# Patient Record
Sex: Female | Born: 1995 | Hispanic: No | Marital: Single
Health system: Northeastern US, Academic
[De-identification: ages and names within clinical notes are randomized; demographics above are authoritative.]

## PROBLEM LIST (undated history)

## (undated) DIAGNOSIS — R6882 Decreased libido: Secondary | ICD-10-CM

## (undated) DIAGNOSIS — B9689 Other specified bacterial agents as the cause of diseases classified elsewhere: Secondary | ICD-10-CM

## (undated) DIAGNOSIS — N760 Acute vaginitis: Secondary | ICD-10-CM

## (undated) DIAGNOSIS — N898 Other specified noninflammatory disorders of vagina: Secondary | ICD-10-CM

## (undated) HISTORY — PX: WISDOM TOOTH EXTRACTION: SHX21

## (undated) HISTORY — PX: OTHER SURGICAL HISTORY: SHX169

## (undated) HISTORY — DX: Acute vaginitis: N76.0

## (undated) HISTORY — DX: Other specified noninflammatory disorders of vagina: N89.8

## (undated) HISTORY — PX: BREAST ENHANCEMENT SURGERY: SHX7

## (undated) HISTORY — PX: CLEFT PALATE REPAIR: SUR1165

## (undated) HISTORY — DX: Other specified bacterial agents as the cause of diseases classified elsewhere: B96.89

## (undated) HISTORY — DX: Decreased libido: R68.82

---
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
## 2015-06-03 ENCOUNTER — Emergency Department (HOSPITAL_COMMUNITY)
Admission: EM | Admit: 2015-06-03 | Discharge: 2015-06-03 | Disposition: A | Discharge status: Home / Self Care | Payer: Commercial Managed Care - HMO | Attending: Emergency Medicine | Admitting: Emergency Medicine

## 2015-06-03 ENCOUNTER — Encounter (HOSPITAL_COMMUNITY): Payer: Self-pay | Admitting: Emergency Medicine

## 2015-06-03 DIAGNOSIS — J029 Acute pharyngitis, unspecified: Secondary | ICD-10-CM | POA: Diagnosis present

## 2015-06-03 DIAGNOSIS — Z79899 Other long term (current) drug therapy: Secondary | ICD-10-CM | POA: Insufficient documentation

## 2015-06-03 DIAGNOSIS — Z87730 Personal history of (corrected) cleft lip and palate: Secondary | ICD-10-CM | POA: Diagnosis not present

## 2015-06-03 DIAGNOSIS — J019 Acute sinusitis, unspecified: Secondary | ICD-10-CM | POA: Insufficient documentation

## 2015-06-03 LAB — RAPID STREP SCREEN (MED CTR MEBANE ONLY): Streptococcus, Group A Screen (Direct): NEGATIVE

## 2015-06-03 MED ORDER — AMOXICILLIN 500 MG PO CAPS: 500.0000 mg | ORAL_CAPSULE | Freq: Three times a day (TID) | ORAL | Status: DC

## 2015-06-03 MED ORDER — PSEUDOEPHEDRINE HCL 60 MG PO TABS: 60.0000 mg | ORAL_TABLET | ORAL | Status: DC | PRN

## 2015-06-03 MED ORDER — MAGIC MOUTHWASH W/LIDOCAINE: 5.0000 mL | Freq: Three times a day (TID) | ORAL | Status: DC | PRN

## 2015-06-03 NOTE — ED Provider Notes (Signed)
CSN: [id number]     Arrival date & time 06/03/15  0907 History   First MD Initiated Contact with Patient 06/03/15 [phone]     Chief Complaint  Patient presents with  . Sore Throat     (Consider location/radiation/quality/duration/timing/severity/associated sxs/prior Treatment) HPI   Tiffany Pugh is a 19 y.o. female who presents to the Emergency Department complaining of sore throat, cough, and nasal drainage for 3 days. States cough is mostly been nonproductive. She also reports facial pressure and pain with bending over. She has been producing nasal mucus. She's been using over-the-counter cold and cough medication without relief. 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 She denies any abdominal pain, vomiting, fever or chills, chest pain or shortness of breath.   History reviewed. No pertinent past medical history. Past Surgical History  Procedure Laterality Date  . Cleft palate repair    . Cleft palate repair    . Lafort ostomy    . Wisdom tooth extraction     History reviewed. No pertinent family history. Social History  Substance Use Topics  . Smoking status: Never Smoker   . Smokeless tobacco: None  . Alcohol Use: No   OB History    No data available     Review of Systems  Constitutional: Negative for fever, chills, activity change and appetite change.  HENT: Positive for congestion, rhinorrhea, sinus pressure and sore throat. Negative for ear pain, facial swelling, trouble swallowing and voice change.   Eyes: Negative for pain and visual disturbance.  Respiratory: Negative for cough and shortness of breath.   Gastrointestinal: Negative for nausea, vomiting and abdominal pain.  Musculoskeletal: Negative for arthralgias, neck pain and neck stiffness.  Skin: Negative for color change and rash.  Neurological: Negative for dizziness, facial asymmetry, speech difficulty, numbness and headaches.  Hematological: Negative for adenopathy.   All other systems reviewed and are negative.     Allergies  Bee venom  Home Medications   Prior to Admission medications   Medication Sig Start Date End Date Taking? Authorizing Provider  cyclobenzaprine (FLEXERIL) 10 MG tablet Take 10 mg by mouth 3 (three) times daily as needed for muscle spasms.   Yes Historical Provider, MD  GuaiFENesin (MUCINEX PO) Take 1 tablet by mouth daily.   Yes Historical Provider, MD  ibuprofen (ADVIL,MOTRIN) 800 MG tablet Take 1 tablet (800 mg total) by mouth every 8 (eight) hours as needed for pain. 11/28/12  Yes Christopher Lawyer, PA-C   BP 120/67 mmHg  Pulse 83  Temp(Src) 97.6 F (36.4 C) (Oral)  Resp 18  Ht 5\' 4"  (1.626 m)  Wt 48.988 kg  BMI 18.53 kg/m2  SpO2 96%  LMP 05/25/2015 Physical Exam  Constitutional: She is oriented to person, place, and time. She appears well-developed and well-nourished. No distress.  HENT:  Head: Normocephalic and atraumatic.  Right Ear: Tympanic membrane and ear canal normal.  Left Ear: Tympanic membrane and ear canal normal.  Nose: Mucosal edema present.  Mouth/Throat: Uvula is midline and mucous membranes are normal. No trismus in the jaw. No uvula swelling. Posterior oropharyngeal edema and posterior oropharyngeal erythema present. No tonsillar abscesses.  Patient has cleft palate with previous repair  Neck: Normal range of motion. Neck supple.  Cardiovascular: Normal rate, regular rhythm and normal heart sounds.   Pulmonary/Chest: Effort normal and breath sounds normal. No stridor. No respiratory distress.  Abdominal: Soft. There is no splenomegaly. There is no tenderness.  Musculoskeletal: Normal range of  motion.  Lymphadenopathy:    She has no cervical adenopathy.  Neurological: She is alert and oriented to person, place, and time. She exhibits normal muscle tone. Coordination normal.  Skin: Skin is warm and dry.  Psychiatric: She has a normal mood and affect.  Nursing note and vitals reviewed.   ED  Course  Procedures (including critical care time) Labs Review Labs Reviewed  RAPID STREP SCREEN (NOT AT [facility])    Imaging Review No results found. I have personally reviewed and evaluated these images and lab results as part of my medical decision-making.    MDM   Final diagnoses:  Acute sinusitis, recurrence not specified, unspecified location    Patient is well-appearing. Vital signs are stable. She is nontoxic, airway is patent no concerning symptoms for peritonsillar abscess. Pt agrees to symptomatic tx and close PMD f/u if needed.    Kem Parkinson, PA-C 06/07/15 1314  Fredia Sorrow, MD 06/08/15 1310

## 2015-06-03 NOTE — ED Notes (Signed)
PT c/o sore throat with nasal drainage x3 days. PT denies any fever.

## 2015-06-03 NOTE — Discharge Instructions (Signed)
Sinusitis, Adult  Sinusitis is redness, soreness, and puffiness (inflammation) of the air pockets in the bones of your face (sinuses). The redness, soreness, and puffiness can cause air and mucus to get trapped in your sinuses. This can allow germs to grow and cause an infection.   HOME CARE    Drink enough fluids to keep your pee (urine) clear or pale yellow.   Use a humidifier in your home.   Run a hot shower to create steam in the bathroom. Sit in the bathroom with the door closed. Breathe in the steam 3-4 times a day.   Put a warm, moist washcloth on your face 3-4 times a day, or as told by your doctor.   Use salt water sprays (saline sprays) to wet the thick fluid in your nose. This can help the sinuses drain.   Only take medicine as told by your doctor.  GET HELP RIGHT AWAY IF:    Your pain gets worse.   You have very bad headaches.   You are sick to your stomach (nauseous).   You throw up (vomit).   You are very sleepy (drowsy) all the time.   Your face is puffy (swollen).   Your vision changes.   You have a stiff neck.   You have trouble breathing.  MAKE SURE YOU:    Understand these instructions.   Will watch your condition.   Will get help right away if you are not doing well or get worse.     This information is not intended to replace advice given to you by your health care provider. Make sure you discuss any questions you have with your health care provider.     Document Released: 11/27/2007 Document Revised: 07/01/2014 Document Reviewed: 01/14/2012  Elsevier Interactive Patient Education 2016 Elsevier Inc[employer]

## 2015-06-03 NOTE — ED Notes (Signed)
Pt made aware to return if symptoms worsen or if any life threatening symptoms occur.   

## 2015-06-07 LAB — CULTURE, GROUP A STREP

## 2015-08-07 ENCOUNTER — Emergency Department (HOSPITAL_COMMUNITY)
Admission: EM | Admit: 2015-08-07 | Discharge: 2015-08-07 | Disposition: A | Discharge status: Home / Self Care | Payer: Commercial Managed Care - HMO | Attending: Emergency Medicine | Admitting: Emergency Medicine

## 2015-08-07 ENCOUNTER — Encounter (HOSPITAL_COMMUNITY): Payer: Self-pay | Admitting: *Deleted

## 2015-08-07 DIAGNOSIS — Z791 Long term (current) use of non-steroidal anti-inflammatories (NSAID): Secondary | ICD-10-CM | POA: Insufficient documentation

## 2015-08-07 DIAGNOSIS — B349 Viral infection, unspecified: Secondary | ICD-10-CM | POA: Diagnosis not present

## 2015-08-07 DIAGNOSIS — Z79899 Other long term (current) drug therapy: Secondary | ICD-10-CM | POA: Diagnosis not present

## 2015-08-07 DIAGNOSIS — R52 Pain, unspecified: Secondary | ICD-10-CM | POA: Diagnosis present

## 2015-08-07 MED ORDER — ONDANSETRON 4 MG PO TBDP
ORAL_TABLET | ORAL | Status: AC
  Filled 2015-08-07: qty 1

## 2015-08-07 MED ORDER — IBUPROFEN 800 MG PO TABS
800.0000 mg | ORAL_TABLET | Freq: Once | ORAL | Status: AC
  Administered 2015-08-07: 800 mg via ORAL
  Filled 2015-08-07: qty 1

## 2015-08-07 MED ORDER — ONDANSETRON 4 MG PO TBDP: 4.0000 mg | ORAL_TABLET | Freq: Three times a day (TID) | ORAL | Status: DC | PRN

## 2015-08-07 MED ORDER — IBUPROFEN 800 MG PO TABS: 800.0000 mg | ORAL_TABLET | Freq: Three times a day (TID) | ORAL | Status: DC | PRN

## 2015-08-07 MED ORDER — ONDANSETRON 4 MG PO TBDP
4.0000 mg | ORAL_TABLET | Freq: Once | ORAL | Status: AC
  Administered 2015-08-07: 4 mg via ORAL

## 2015-08-07 NOTE — ED Provider Notes (Signed)
TIME SEEN: 4:55 AM  CHIEF COMPLAINT: Headache, sore throat, nasal congestion, dry cough, body aches  HPI: Pt is a 20 y.o. female with history of cleft palate repair who presents to the emergency department with complaints of one day of generalized starting headache, sore throat, nasal congestion, dry cough and body aches. No fevers or chills. No ear pain. No vomiting or diarrhea. No rash. No sick contacts or recent travel. States she was looking on the Internet and was concerned that she could have meningitis and also prompted her to come to the emergency department. She tried cough medicine at home without relief of symptoms.  ROS: See HPI Constitutional: no fever  Eyes: no drainage  ENT:  runny nose   Cardiovascular:  no chest pain  Resp: no SOB  GI: no vomiting GU: no dysuria Integumentary: no rash  Allergy: no hives  Musculoskeletal: no leg swelling  Neurological: no slurred speech ROS otherwise negative  PAST MEDICAL HISTORY/PAST SURGICAL HISTORY:  History reviewed. No pertinent past medical history.  MEDICATIONS:  Prior to Admission medications   Medication Sig Start Date End Date Taking? Authorizing Provider  amoxicillin (AMOXIL) 500 MG capsule Take 1 capsule (500 mg total) by mouth 3 (three) times daily. For 7 days 06/03/15   Tammy Triplett, PA-C  cyclobenzaprine (FLEXERIL) 10 MG tablet Take 10 mg by mouth 3 (three) times daily as needed for muscle spasms.    Historical Provider, MD  GuaiFENesin (MUCINEX PO) Take 1 tablet by mouth daily.    Historical Provider, MD  magic mouthwash w/lidocaine SOLN Take 5 mLs by mouth 3 (three) times daily as needed for mouth pain. Swish and spit, do not swallow 06/03/15   Tammy Triplett, PA-C  naproxen (NAPROSYN) 500 MG tablet Take 500 mg by mouth 2 (two) times daily with a meal.    Historical Provider, MD  pseudoephedrine (SUDAFED) 60 MG tablet Take 1 tablet (60 mg total) by mouth every 4 (four) hours as needed for congestion. 06/03/15   Tammy  Triplett, PA-C    ALLERGIES:  Allergies  Allergen Reactions  . Bee Venom Rash    SOCIAL HISTORY:  Social History  Substance Use Topics  . Smoking status: Never Smoker   . Smokeless tobacco: Not on file  . Alcohol Use: No    FAMILY HISTORY: History reviewed. No pertinent family history.  EXAM: BP 121/78 mmHg  Pulse 107  Temp(Src) 98.7 F (37.1 C)  Resp 18  Ht 5' 4.5" (1.638 m)  Wt 106 lb (48.081 kg)  BMI 17.92 kg/m2  SpO2 98%  LMP 08/01/2015 CONSTITUTIONAL: Alert and oriented and responds appropriately to questions. Well-appearing; well-nourished, afebrile, nontoxic HEAD: Normocephalic EYES: Conjunctivae clear, PERRL ENT: normal nose; no rhinorrhea; moist mucous membranes; pharynx without lesions noted, clear nasal edition in bilateral nostrils, some clear nasal sinus drainage in the posterior oropharynx, no tonsillar hypertrophy or exudate, no uvular deviation, no trismus or drooling, no Ludwig's angina, status post cleft palate repair NECK: Supple, no meningismus, no LAD, no nuchal rigidity  CARD: Regular and minimally tachycardic; S1 and S2 appreciated; no murmurs, no clicks, no rubs, no gallops RESP: Normal chest excursion without splinting or tachypnea; breath sounds clear and equal bilaterally; no wheezes, no rhonchi, no rales, no hypoxia or respiratory distress, speaking full sentences ABD/GI: Normal bowel sounds; non-distended; soft, non-tender, no rebound, no guarding, no peritoneal signs BACK:  The back appears normal and is non-tender to palpation, there is no CVA tenderness EXT: Normal ROM in all joints; non-tender  to palpation; no edema; normal capillary refill; no cyanosis, no calf tenderness or swelling    SKIN: Normal color for age and race; warm no rash NEURO: Moves all extremities equally, sensation to light touch intact diffusely, cranial nerves II through XII intact normal gait PSYCH: The patient's mood and manner are appropriate. Appears anxious.  Grooming and personal hygiene are appropriate.  MEDICAL DECISION MAKING: Patient here with complaints of viral-like symptoms. She is concerned for meningitis. Her exam is completely benign and she is not febrile and has no nuchal rigidity or meningismus on exam. Neurologically intact. Discussed with her that I suspect this is a viral illness and I am not concerned for meningitis, pneumonia or other life-threatening illness at this time. I recommend she increase her fluid intake, rest and alternate Tylenol and ibuprofen. Given she is not having fever do not think at this time she has influenza. I do feel she is safe to go home. I provided her with a school note. We'll give her outpatient PCP follow-up information. Discussed return precautions. She verbalized understanding and is comfortable with this plan.       [place], DO 08/07/15 [phone]

## 2015-08-07 NOTE — ED Notes (Signed)
Pt c/o generalized aches and pains x 1 day

## 2015-08-07 NOTE — Discharge Instructions (Signed)
You may alternate between Tylenol 1000 mg every 6 hours as needed for fever and pain ibuprofen 800 mg every 8 hours as needed for fever and pain. Please rest and drink plenty of fluids.   Viral Infections A viral infection can be caused by different types of viruses.Most viral infections are not serious and resolve on their own. However, some infections may cause severe symptoms and may lead to further complications. SYMPTOMS Viruses can frequently cause:  Minor sore throat.  Aches and pains.  Headaches.  Runny nose.  Different types of rashes.  Watery eyes.  Tiredness.  Cough.  Loss of appetite.  Gastrointestinal infections, resulting in nausea, vomiting, and diarrhea. These symptoms do not respond to antibiotics because the infection is not caused by bacteria. However, you might catch a bacterial infection following the viral infection. This is sometimes called a "superinfection." Symptoms of such a bacterial infection may include:  Worsening sore throat with pus and difficulty swallowing.  Swollen neck glands.  Chills and a high or persistent fever.  Severe headache.  Tenderness over the sinuses.  Persistent overall ill feeling (malaise), muscle aches, and tiredness (fatigue).  Persistent cough.  Yellow, green, or brown mucus production with coughing. HOME CARE INSTRUCTIONS   Only take over-the-counter or prescription medicines for pain, discomfort, diarrhea, or fever as directed by your caregiver.  Drink enough water and fluids to keep your urine clear or pale yellow. Sports drinks can provide valuable electrolytes, sugars, and hydration.  Get plenty of rest and maintain proper nutrition. Soups and broths with crackers or rice are fine. SEEK IMMEDIATE MEDICAL CARE IF:   You have severe headaches, shortness of breath, chest pain, neck pain, or an unusual rash.  You have uncontrolled vomiting, diarrhea, or you are unable to keep down fluids.  You or your  child has an oral temperature above 102 F (38.9 C), not controlled by medicine.  Your baby is older than 3 months with a rectal temperature of 102 F (38.9 C) or higher.  Your baby is 33 months old or younger with a rectal temperature of 100.4 F (38 C) or higher. MAKE SURE YOU:   Understand these instructions.  Will watch your condition.  Will get help right away if you are not doing well or get worse.   This information is not intended to replace advice given to you by your health care provider. Make sure you discuss any questions you have with your health care provider.   Document Released: 03/20/2005 Document Revised: 09/02/2011 Document Reviewed: 11/16/2014 Elsevier Interactive Patient Education [employer].

## 2015-08-28 HISTORY — PX: RHINOPLASTY: SUR1284

## 2015-10-26 ENCOUNTER — Encounter (HOSPITAL_COMMUNITY): Payer: Self-pay | Admitting: Emergency Medicine

## 2015-10-26 ENCOUNTER — Emergency Department (HOSPITAL_COMMUNITY)
Admission: EM | Admit: 2015-10-26 | Discharge: 2015-10-26 | Disposition: A | Discharge status: Home / Self Care | Payer: Commercial Managed Care - HMO | Attending: Emergency Medicine | Admitting: Emergency Medicine

## 2015-10-26 DIAGNOSIS — N760 Acute vaginitis: Secondary | ICD-10-CM | POA: Diagnosis not present

## 2015-10-26 DIAGNOSIS — S39012A Strain of muscle, fascia and tendon of lower back, initial encounter: Secondary | ICD-10-CM | POA: Diagnosis not present

## 2015-10-26 DIAGNOSIS — Y929 Unspecified place or not applicable: Secondary | ICD-10-CM | POA: Diagnosis not present

## 2015-10-26 DIAGNOSIS — S29012A Strain of muscle and tendon of back wall of thorax, initial encounter: Secondary | ICD-10-CM | POA: Diagnosis not present

## 2015-10-26 DIAGNOSIS — Y9389 Activity, other specified: Secondary | ICD-10-CM | POA: Diagnosis not present

## 2015-10-26 DIAGNOSIS — Y999 Unspecified external cause status: Secondary | ICD-10-CM | POA: Insufficient documentation

## 2015-10-26 DIAGNOSIS — X500XXA Overexertion from strenuous movement or load, initial encounter: Secondary | ICD-10-CM | POA: Diagnosis not present

## 2015-10-26 DIAGNOSIS — S3992XA Unspecified injury of lower back, initial encounter: Secondary | ICD-10-CM | POA: Diagnosis present

## 2015-10-26 DIAGNOSIS — Z792 Long term (current) use of antibiotics: Secondary | ICD-10-CM | POA: Diagnosis not present

## 2015-10-26 DIAGNOSIS — B9689 Other specified bacterial agents as the cause of diseases classified elsewhere: Secondary | ICD-10-CM

## 2015-10-26 DIAGNOSIS — S29019A Strain of muscle and tendon of unspecified wall of thorax, initial encounter: Secondary | ICD-10-CM

## 2015-10-26 LAB — WET PREP, GENITAL
Sperm: NONE SEEN
Trich, Wet Prep: NONE SEEN
YEAST WET PREP: NONE SEEN

## 2015-10-26 LAB — URINALYSIS, ROUTINE W REFLEX MICROSCOPIC
Bilirubin Urine: NEGATIVE
GLUCOSE, UA: NEGATIVE mg/dL
HGB URINE DIPSTICK: NEGATIVE
Ketones, ur: NEGATIVE mg/dL
LEUKOCYTES UA: NEGATIVE
NITRITE: NEGATIVE
Protein, ur: NEGATIVE mg/dL
pH: 5.5 (ref 5.0–8.0)

## 2015-10-26 MED ORDER — METRONIDAZOLE 500 MG PO TABS: 500.0000 mg | ORAL_TABLET | Freq: Two times a day (BID) | ORAL | Status: DC

## 2015-10-26 MED ORDER — METHOCARBAMOL 500 MG PO TABS: 500.0000 mg | ORAL_TABLET | Freq: Two times a day (BID) | ORAL | Status: DC

## 2015-10-26 MED ORDER — NAPROXEN 375 MG PO TABS: 375.0000 mg | ORAL_TABLET | Freq: Two times a day (BID) | ORAL | Status: DC

## 2015-10-26 NOTE — ED Provider Notes (Signed)
CSN: [id number]     Arrival date & time 10/26/15  1822 History   First MD Initiated Contact with Patient 10/26/15 1838     Chief Complaint  Patient presents with  . Back Pain     (Consider location/radiation/quality/duration/timing/severity/associated sxs/prior Treatment) Patient is a 20 y.o. female presenting with back pain. The history is provided by the patient.  Back Pain Location:  Thoracic spine Radiates to:  Does not radiate Pain severity:  Moderate Onset quality:  Gradual Duration:  2 days Timing:  Constant Progression:  Unchanged Chronicity:  New Context: physical stress   Relieved by:  None tried Worsened by:  Movement Ineffective treatments:  None tried   ZEPHYR FEBLES is a 20 y.o. female who presents to the ED with back pain that started 2 days ago. She reports that she was lifting weight and had the weight on her shoulders and thinks she had two much.   Patient also complains of vaginal d/c and fowl smell when she urinates, hx of BV in the past and she wants to get checked.   History reviewed. No pertinent past medical history. Past Surgical History  Procedure Laterality Date  . Cleft palate repair    . Cleft palate repair    . Lafort ostomy    . Wisdom tooth extraction     History reviewed. No pertinent family history. Social History  Substance Use Topics  . Smoking status: Never Smoker   . Smokeless tobacco: None  . Alcohol Use: No   OB History    No data available     Review of Systems  Genitourinary: Positive for frequency and vaginal discharge.  Musculoskeletal: Positive for back pain.  all other systems negtive    Allergies  Bee venom  Home Medications   Prior to Admission medications   Medication Sig Start Date End Date Taking? Authorizing Provider  amoxicillin (AMOXIL) 500 MG capsule Take 1 capsule (500 mg total) by mouth 3 (three) times daily. For 7 days 06/03/15   Tammy Triplett, PA-C  GuaiFENesin (MUCINEX PO) Take 1 tablet by  mouth daily.    Historical Provider, MD  ibuprofen (ADVIL,MOTRIN) 800 MG tablet Take 1 tablet (800 mg total) by mouth every 8 (eight) hours as needed for mild pain. 08/07/15   Kristen N Ward, DO  magic mouthwash w/lidocaine SOLN Take 5 mLs by mouth 3 (three) times daily as needed for mouth pain. Swish and spit, do not swallow 06/03/15   Tammy Triplett, PA-C  methocarbamol (ROBAXIN) 500 MG tablet Take 1 tablet (500 mg total) by mouth 2 (two) times daily. 10/26/15   Hope Bunnie Pion, NP  metroNIDAZOLE (FLAGYL) 500 MG tablet Take 1 tablet (500 mg total) by mouth 2 (two) times daily. 10/26/15   [place], NP  naproxen (NAPROSYN) 375 MG tablet Take 1 tablet (375 mg total) by mouth 2 (two) times daily. 10/26/15   Hope Bunnie Pion, NP  ondansetron (ZOFRAN ODT) 4 MG disintegrating tablet Take 1 tablet (4 mg total) by mouth every 8 (eight) hours as needed for nausea or vomiting. 08/07/15   Kristen N Ward, DO  pseudoephedrine (SUDAFED) 60 MG tablet Take 1 tablet (60 mg total) by mouth every 4 (four) hours as needed for congestion. 06/03/15   Tammy Triplett, PA-C   BP 110/66 mmHg  Pulse 73  Temp(Src) 98 F (36.7 C) (Oral)  Resp 18  Ht 5\' 4"  (1.626 m)  Wt 47.628 kg  BMI 18.01 kg/m2  SpO2 100%  LMP  10/05/2015 Physical Exam  Constitutional: She is oriented to person, place, and time. She appears well-developed and well-nourished. No distress.  HENT:  Head: Normocephalic and atraumatic.  Nose: Nose normal.  Scar to upper lip as result of clef lip repair  Eyes: Conjunctivae and EOM are normal.  Neck: Normal range of motion. Neck supple.  Cardiovascular: Normal rate and regular rhythm.   Pulmonary/Chest: Effort normal. She has no wheezes. She has no rales.  Abdominal: Soft. Bowel sounds are normal. There is no tenderness.  Musculoskeletal: Normal range of motion.       Thoracic back: She exhibits tenderness and spasm. She exhibits normal range of motion and normal pulse.       Back:  Neurological: She is alert  and oriented to person, place, and time. She has normal strength. No cranial nerve deficit or sensory deficit. Gait normal.  Reflex Scores:      Bicep reflexes are 2+ on the right side and 2+ on the left side.      Brachioradialis reflexes are 2+ on the right side and 2+ on the left side.      Patellar reflexes are 2+ on the right side and 2+ on the left side.      Achilles reflexes are 2+ on the right side and 2+ on the left side. Skin: Skin is warm and dry.  Psychiatric: She has a normal mood and affect. Her behavior is normal.  Nursing note and vitals reviewed.   ED Course  Procedures (including critical care time) Labs Review Results for orders placed or performed during the hospital encounter of 10/26/15 (from the past 24 hour(s))  Urinalysis, Routine w reflex microscopic (not at [facility])     Status: Abnormal   Collection Time: 10/26/15  6:34 PM  Result Value Ref Range   Color, Urine YELLOW YELLOW   APPearance CLEAR CLEAR   Specific Gravity, Urine >1.030 (H) 1.005 - 1.030   pH 5.5 5.0 - 8.0   Glucose, UA NEGATIVE NEGATIVE mg/dL   Hgb urine dipstick NEGATIVE NEGATIVE   Bilirubin Urine NEGATIVE NEGATIVE   Ketones, ur NEGATIVE NEGATIVE mg/dL   Protein, ur NEGATIVE NEGATIVE mg/dL   Nitrite NEGATIVE NEGATIVE   Leukocytes, UA NEGATIVE NEGATIVE  Wet prep, genital     Status: Abnormal   Collection Time: 10/26/15  7:15 PM  Result Value Ref Range   Yeast Wet Prep HPF POC NONE SEEN NONE SEEN   Trich, Wet Prep NONE SEEN NONE SEEN   Clue Cells Wet Prep HPF POC PRESENT (A) NONE SEEN   WBC, Wet Prep HPF POC FEW (A) NONE SEEN   Sperm NONE SEEN      MDM  20 y.o. female with thoracic back pain after lifting weight and feeling a strain in her back and vaginal d/c with odor stable for d/c without focal neuro deficits that indicate immediate neuro consult. She  Does not have an acute abdomen or signs of PID. Will treat for muscle spasm of the back and for BV. Discussed with the patient clinical  and lab findings and plan of care and all questioned fully answered. She will follow up with Dr. Glo Herring or return if any problems arise.   Final diagnoses:  Thoracic myofascial strain, initial encounter  Bacterial vaginosis      Ashley Murrain, NP 10/26/15 [place], MD 10/27/15 [phone]

## 2015-10-26 NOTE — ED Notes (Signed)
Having back pain for last 2 days, rates pain 5/10.  Frequent urination and foul smelling urine.  Denies any burning.

## 2015-10-26 NOTE — Discharge Instructions (Signed)
Do not drive while taking the muscle relaxant because it may make you sleepy. Follow up at Dr. Johnnye Sima office. Return here as needed.

## 2015-10-30 LAB — GC/CHLAMYDIA PROBE AMP (~~LOC~~) NOT AT ARMC
Chlamydia: NEGATIVE
Neisseria Gonorrhea: NEGATIVE

## 2015-11-08 ENCOUNTER — Encounter: Payer: Self-pay | Admitting: Adult Health

## 2015-11-08 ENCOUNTER — Other Ambulatory Visit (HOSPITAL_COMMUNITY)
Admission: RE | Admit: 2015-11-08 | Discharge: 2015-11-08 | Disposition: A | Discharge status: Home / Self Care | Payer: Commercial Managed Care - HMO | Source: Ambulatory Visit | Attending: Adult Health | Admitting: Adult Health

## 2015-11-08 ENCOUNTER — Ambulatory Visit (INDEPENDENT_AMBULATORY_CARE_PROVIDER_SITE_OTHER): Payer: Commercial Managed Care - HMO | Admitting: Adult Health

## 2015-11-08 VITALS — BP 90/64 | HR 72 | Ht 64.5 in | Wt 102.0 lb

## 2015-11-08 DIAGNOSIS — R6882 Decreased libido: Secondary | ICD-10-CM | POA: Diagnosis not present

## 2015-11-08 DIAGNOSIS — Z01411 Encounter for gynecological examination (general) (routine) with abnormal findings: Secondary | ICD-10-CM | POA: Diagnosis not present

## 2015-11-08 DIAGNOSIS — N760 Acute vaginitis: Secondary | ICD-10-CM | POA: Diagnosis not present

## 2015-11-08 DIAGNOSIS — B9689 Other specified bacterial agents as the cause of diseases classified elsewhere: Secondary | ICD-10-CM

## 2015-11-08 DIAGNOSIS — N898 Other specified noninflammatory disorders of vagina: Secondary | ICD-10-CM | POA: Insufficient documentation

## 2015-11-08 DIAGNOSIS — Z01419 Encounter for gynecological examination (general) (routine) without abnormal findings: Secondary | ICD-10-CM

## 2015-11-08 DIAGNOSIS — Z113 Encounter for screening for infections with a predominantly sexual mode of transmission: Secondary | ICD-10-CM | POA: Diagnosis present

## 2015-11-08 DIAGNOSIS — Z3009 Encounter for other general counseling and advice on contraception: Secondary | ICD-10-CM

## 2015-11-08 HISTORY — DX: Decreased libido: R68.82

## 2015-11-08 HISTORY — DX: Other specified noninflammatory disorders of vagina: N89.8

## 2015-11-08 HISTORY — DX: Other specified bacterial agents as the cause of diseases classified elsewhere: N76.0

## 2015-11-08 HISTORY — DX: Other specified bacterial agents as the cause of diseases classified elsewhere: B96.89

## 2015-11-08 LAB — POCT WET PREP (WET MOUNT): WBC, Wet Prep HPF POC: POSITIVE

## 2015-11-08 NOTE — Patient Instructions (Signed)
Bacterial Vaginosis Bacterial vaginosis is a vaginal infection that occurs when the normal balance of bacteria in the vagina is disrupted. It results from an overgrowth of certain bacteria. This is the most common vaginal infection in women of childbearing age. Treatment is important to prevent complications, especially in pregnant women, as it can cause a premature delivery. CAUSES  Bacterial vaginosis is caused by an increase in harmful bacteria that are normally present in smaller amounts in the vagina. Several different kinds of bacteria can cause bacterial vaginosis. However, the reason that the condition develops is not fully understood. RISK FACTORS Certain activities or behaviors can put you at an increased risk of developing bacterial vaginosis, including:  Having a new sex partner or multiple sex partners.  Douching.  Using an intrauterine device (IUD) for contraception. Women do not get bacterial vaginosis from toilet seats, bedding, swimming pools, or contact with objects around them. SIGNS AND SYMPTOMS  Some women with bacterial vaginosis have no signs or symptoms. Common symptoms include:  Grey vaginal discharge.  A fishlike odor with discharge, especially after sexual intercourse.  Itching or burning of the vagina and vulva.  Burning or pain with urination. DIAGNOSIS  Your health care provider will take a medical history and examine the vagina for signs of bacterial vaginosis. A sample of vaginal fluid may be taken. Your health care provider will look at this sample under a microscope to check for bacteria and abnormal cells. A vaginal pH test may also be done.  TREATMENT  Bacterial vaginosis may be treated with antibiotic medicines. These may be given in the form of a pill or a vaginal cream. A second round of antibiotics may be prescribed if the condition comes back after treatment. Because bacterial vaginosis increases your risk for sexually transmitted diseases, getting  treated can help reduce your risk for chlamydia, gonorrhea, HIV, and herpes. HOME CARE INSTRUCTIONS   Only take over-the-counter or prescription medicines as directed by your health care provider.  If antibiotic medicine was prescribed, take it as directed. Make sure you finish it even if you start to feel better.  Tell all sexual partners that you have a vaginal infection. They should see their health care provider and be treated if they have problems, such as a mild rash or itching.  During treatment, it is important that you follow these instructions:  Avoid sexual activity or use condoms correctly.  Do not douche.  Avoid alcohol as directed by your health care provider.  Avoid breastfeeding as directed by your health care provider. SEEK MEDICAL CARE IF:   Your symptoms are not improving after 3 days of treatment.  You have increased discharge or pain.  You have a fever. MAKE SURE YOU:   Understand these instructions.  Will watch your condition.  Will get help right away if you are not doing well or get worse. FOR MORE INFORMATION  Centers for Disease Control and Prevention, Division of STD Prevention: [employer] American Sexual Health Association (ASHA): www.ashastd.org    This information is not intended to replace advice given to you by your health care provider. Make sure you discuss any questions you have with your health care provider.   Document Released: 06/10/2005 Document Revised: 07/01/2014 Document Reviewed: 01/20/2013 Elsevier Interactive Patient Education 2016 [employer]. Try replens or luvena Use condoms Physical in 1 year Take flagyl, no alcohol

## 2015-11-08 NOTE — Progress Notes (Signed)
Patient ID: Tiffany Pugh, female   DOB: 11-28-1995, 20 y.o.   MRN: [id number] History of Present Illness:  Tiffany Pugh is a 20 year old white female, single in for a well woman gyn exam and pap, and she complains of vaginal dryness and decreased libido.She was seen in ER and treated for BV but has not taken flagyl yet.She was on depo from 04/2014 till 11/2014.She thinks she has never had orgasm.  Current Medications, Allergies, Past Medical History, Past Surgical History, Family History and Social History were reviewed in [employer] record.     Review of Systems: Patient denies any headaches, hearing loss, fatigue, blurred vision, shortness of breath, chest pain, abdominal pain, problems with bowel movements, urination, or intercourse(not currently having sex). No joint pain or mood swings.See HPI for positives.    Physical Exam:BP 90/64 mmHg  Pulse 72  Ht 5' 4.5" (1.638 m)  Wt 102 lb (46.267 kg)  BMI 17.24 kg/m2  LMP 10/05/2015 General:  Well developed, well nourished, no acute distress Skin:  Warm and dry, has had cleft lip and palate repair(at [facility]) Neck:  Midline trachea, normal thyroid, good ROM, no lymphadenopathy Lungs; Clear to auscultation bilaterally Breast:  No dominant palpable mass, retraction, or nipple discharge Cardiovascular: Regular rate and rhythm Abdomen:  Soft, non tender, no hepatosplenomegaly Pelvic:  External genitalia is normal in appearance, no lesions.  The vagina is normal in appearance, has white discharge. Urethra has no lesions or masses. The cervix is nulliparous, pap with GC/CHL performed,and wet prep was +WBC and few Clue cells.  Uterus is felt to be normal size, shape, and contour.  No adnexal masses or tenderness noted.Bladder is non tender, no masses felt Extremities/musculoskeletal:  No swelling or varicosities noted, no clubbing or cyanosis Psych:  No mood changes, alert and cooperative,seems happy Discussed that when stopping  depo could take months to resume normal regular periods and vaginal moisture.Also discussed orgasms.  Impression: Well woman gyn exam and pap Family planning Vaginal discharge BV Vaginal dryness  Decreased libido    Plan: Take flagyl ,has RX, review handout on BV Try luvena or replens Use condoms Check HIV,RPR and HSV 2 Physical in 1 year, pap in 3 if normal Use astroglide with sex and increased foreplay and clitoral stimulation

## 2015-11-09 LAB — RPR: RPR: NONREACTIVE

## 2015-11-09 LAB — HSV 2 ANTIBODY, IGG: HSV 2 Glycoprotein G Ab, IgG: <0.91 index (ref 0.00–0.90)

## 2015-11-09 LAB — HIV ANTIBODY (ROUTINE TESTING W REFLEX): HIV Screen 4th Generation wRfx: NONREACTIVE

## 2015-11-10 ENCOUNTER — Telehealth: Payer: Self-pay | Admitting: Adult Health

## 2015-11-10 LAB — CYTOLOGY - PAP

## 2015-11-10 MED ORDER — FLUCONAZOLE 150 MG PO TABS: 150.0000 mg | ORAL_TABLET | Freq: Once | ORAL | Status: DC

## 2015-11-10 NOTE — Telephone Encounter (Signed)
Left message pap was normal with negative GC/CHL but had yeast , called diflucan in

## 2015-11-10 NOTE — Telephone Encounter (Signed)
Pt aware of pap and + yeast

## 2015-12-05 ENCOUNTER — Emergency Department (HOSPITAL_COMMUNITY)
Admission: EM | Admit: 2015-12-05 | Discharge: 2015-12-05 | Disposition: A | Discharge status: Home / Self Care | Payer: Commercial Managed Care - HMO | Attending: Emergency Medicine | Admitting: Emergency Medicine

## 2015-12-05 ENCOUNTER — Encounter (HOSPITAL_COMMUNITY): Payer: Self-pay | Admitting: Emergency Medicine

## 2015-12-05 DIAGNOSIS — N898 Other specified noninflammatory disorders of vagina: Secondary | ICD-10-CM | POA: Diagnosis not present

## 2015-12-05 DIAGNOSIS — R634 Abnormal weight loss: Secondary | ICD-10-CM | POA: Diagnosis not present

## 2015-12-05 DIAGNOSIS — R630 Anorexia: Secondary | ICD-10-CM | POA: Diagnosis not present

## 2015-12-05 DIAGNOSIS — R69 Illness, unspecified: Secondary | ICD-10-CM | POA: Diagnosis present

## 2015-12-05 LAB — URINE MICROSCOPIC-ADD ON

## 2015-12-05 LAB — I-STAT CHEM 8, ED
BUN: 10 mg/dL (ref 6–20)
CALCIUM ION: 1.17 mmol/L (ref 1.12–1.23)
CHLORIDE: 103 mmol/L (ref 101–111)
CREATININE: 0.6 mg/dL (ref 0.44–1.00)
Glucose, Bld: 135 mg/dL — ABNORMAL HIGH (ref 65–99)
HCT: 38 % (ref 36.0–46.0)
Hemoglobin: 12.9 g/dL (ref 12.0–15.0)
Potassium: 3.5 mmol/L (ref 3.5–5.1)
Sodium: 141 mmol/L (ref 135–145)
TCO2: 21 mmol/L (ref 0–100)

## 2015-12-05 LAB — URINALYSIS, ROUTINE W REFLEX MICROSCOPIC
BILIRUBIN URINE: NEGATIVE
GLUCOSE, UA: NEGATIVE mg/dL
KETONES UR: NEGATIVE mg/dL
NITRITE: NEGATIVE
PH: 6 (ref 5.0–8.0)

## 2015-12-05 LAB — TSH: TSH: 0.555 u[IU]/mL (ref 0.350–4.500)

## 2015-12-05 LAB — WET PREP, GENITAL
CLUE CELLS WET PREP: NONE SEEN
SPERM: NONE SEEN
TRICH WET PREP: NONE SEEN
YEAST WET PREP: NONE SEEN

## 2015-12-05 LAB — PREGNANCY, URINE: Preg Test, Ur: NEGATIVE

## 2015-12-05 MED ORDER — ONDANSETRON 4 MG PO TBDP: ORAL_TABLET | ORAL | Status: DC

## 2015-12-05 NOTE — Discharge Instructions (Signed)
Follow up closely with a primary doctor.  Please provide a stool sample for your physician to send to lab for possibility of worms.  If you were given medicines take as directed.  If you are on coumadin or contraceptives realize their levels and effectiveness is altered by many different medicines.  If you have any reaction (rash, tongues swelling, other) to the medicines stop taking and see a physician.    If your blood pressure was elevated in the ER make sure you follow up for management with a primary doctor or return for chest pain, shortness of breath or stroke symptoms.  Please follow up as directed and return to the ER or see a physician for new or worsening symptoms.  Thank you. Filed Vitals:   12/05/15 1321 12/05/15 1325  BP: 120/89 120/89  Pulse: 43 100  Temp: 98.5 F (36.9 C) 99.3 F (37.4 C)  TempSrc: Oral Oral  Resp:  16  Height: 5\' 4"  (1.626 m) 5\' 8"  (1.727 m)  Weight: 100 lb (45.36 kg) 160 lb (72.576 kg)  SpO2: 96% 100%

## 2015-12-05 NOTE — ED Notes (Signed)
Pt reports loss of appetite over the past two months with nausea. Reports "I never am hungry." Pt reports she was coughing yesterday and coughed up white phlegm. States, "it looked like a worm."

## 2015-12-05 NOTE — ED Provider Notes (Signed)
CSN: [id number]     Arrival date & time 12/05/15  1259 History   First MD Initiated Contact with Patient 12/05/15 1359     Chief Complaint  Patient presents with  . Illness     (Consider location/radiation/quality/duration/timing/severity/associated sxs/prior Treatment) HPI Comments: 20 year old female with history of cleft palate and multiple surgeries the past year presents with decreased appetite and weight loss worse the past month. Patient has had 7 pound weight loss unintentional. Patient just does not have an appetite. No abdominal pain or fevers. Patient thought there may have been a worm when she coughed. No blood in the stools. No worms identified in the stool. No recent travel no sick contacts. Patient denies eating disorders or intentional vomiting. Patient recently had a workup for STDs and was negative. Patient has not been sexually active since that workup.  Patient is a 20 y.o. female presenting with general illness. The history is provided by the patient.  Illness Associated symptoms: no abdominal pain, no chest pain, no congestion, no fever, no headaches, no rash, no shortness of breath and no vomiting     Past Medical History  Diagnosis Date  . Vaginal discharge 11/08/2015  . BV (bacterial vaginosis) 11/08/2015  . Vaginal dryness 11/08/2015  . Decreased libido 11/08/2015   Past Surgical History  Procedure Laterality Date  . Cleft palate repair    . Cleft palate repair    . Lafort ostomy    . Wisdom tooth extraction    . Rhinoplasty  08/28/15   Family History  Problem Relation Age of Onset  . Stroke Maternal Grandfather    Social History  Substance Use Topics  . Smoking status: Never Smoker   . Smokeless tobacco: Never Used  . Alcohol Use: Yes     Comment: occ   OB History    No data available     Review of Systems  Constitutional: Positive for appetite change. Negative for fever and chills.  HENT: Negative for congestion.   Eyes: Negative for visual  disturbance.  Respiratory: Negative for shortness of breath.   Cardiovascular: Negative for chest pain.  Gastrointestinal: Negative for vomiting and abdominal pain.  Genitourinary: Positive for vaginal discharge. Negative for dysuria and flank pain.  Musculoskeletal: Negative for back pain, neck pain and neck stiffness.  Skin: Negative for rash.  Neurological: Negative for light-headedness and headaches.      Allergies  Bee venom and Hydromorphone  Home Medications   Prior to Admission medications   Medication Sig Start Date End Date Taking? Authorizing Provider  fluconazole (DIFLUCAN) 150 MG tablet Take 1 tablet (150 mg total) by mouth once. Patient not taking: Reported on 12/05/2015 11/10/15   Estill Dooms, NP  ondansetron (ZOFRAN ODT) 4 MG disintegrating tablet 4mg  ODT q4 hours prn nausea/vomit 12/05/15   Elnora Morrison, MD   BP 120/89 mmHg  Pulse 100  Temp(Src) 99.3 F (37.4 C) (Oral)  Resp 16  Ht 5\' 8"  (1.727 m)  Wt 160 lb (72.576 kg)  BMI 24.33 kg/m2  SpO2 100%  LMP 11/24/2015 Physical Exam  Constitutional: She is oriented to person, place, and time. She appears well-developed and well-nourished.  HENT:  Head: Normocephalic and atraumatic.  Eyes: Conjunctivae are normal. Right eye exhibits no discharge. Left eye exhibits no discharge.  Neck: Normal range of motion. Neck supple. No tracheal deviation present.  Cardiovascular: Normal rate and regular rhythm.   Pulmonary/Chest: Effort normal and breath sounds normal.  Abdominal: Soft. She exhibits no distension. There  is no tenderness. There is no guarding.  Genitourinary:  Per patient request physician assistant perform exam. Very minimal discharge vaginal, no cervical motion tenderness  Musculoskeletal: She exhibits no edema.  Neurological: She is alert and oriented to person, place, and time.  Skin: Skin is warm. No rash noted.  Psychiatric: She has a normal mood and affect.  Nursing note and vitals  reviewed.   ED Course  Procedures (including critical care time) Labs Review Labs Reviewed  URINALYSIS, ROUTINE W REFLEX MICROSCOPIC (NOT AT [facility]) - Abnormal; Notable for the following:    Specific Gravity, Urine >1.030 (*)    Hgb urine dipstick MODERATE (*)    Protein, ur TRACE (*)    Leukocytes, UA SMALL (*)    All other components within normal limits  URINE MICROSCOPIC-ADD ON - Abnormal; Notable for the following:    Squamous Epithelial / LPF TOO NUMEROUS TO COUNT (*)    Bacteria, UA MANY (*)    All other components within normal limits  I-STAT CHEM 8, ED - Abnormal; Notable for the following:    Glucose, Bld 135 (*)    All other components within normal limits  WET PREP, GENITAL  PREGNANCY, URINE  TSH  GC/CHLAMYDIA PROBE AMP ([place]) NOT AT [facility]    Imaging Review No results found. I have personally reviewed and evaluated these images and lab results as part of my medical decision-making.   EKG Interpretation None      MDM   Final diagnoses:  Weight loss  Decreased appetite  Vaginal discharge   Patient presents with decreased appetite and weight loss. Patient overall well-appearing the ER. Patient has no urinary symptoms, urinalysis numerous squamous. Screening blood work unremarkable, thyroid testing pending. Patient only close outpatient follow-up with weight loss.  Results and differential diagnosis were discussed with the patient/parent/guardian. Xrays were independently reviewed by myself.  Close follow up outpatient was discussed, comfortable with the plan.   Medications - No data to display  Filed Vitals:   12/05/15 1321 12/05/15 1325  BP: 120/89 120/89  Pulse: 43 100  Temp: 98.5 F (36.9 C) 99.3 F (37.4 C)  TempSrc: Oral Oral  Resp:  16  Height: 5\' 4"  (1.626 m) 5\' 8"  (1.727 m)  Weight: 100 lb (45.36 kg) 160 lb (72.576 kg)  SpO2: 96% 100%    Final diagnoses:  Weight loss  Decreased appetite  Vaginal discharge        Elnora Morrison, MD 12/05/15 1528

## 2015-12-05 NOTE — ED Provider Notes (Signed)
I was asked to perform patient's pelvic exam.  Normal external genitalia. There is mild erythema around the vaginal opening, mildly tender to palpation without any lesions or sores. Normal vaginal canal. Small thin white discharge. Cervix is normal, closed. No CMT. No uterine or adnexal tenderness. No masses palpated. Patient also has an abrasion to the left butt cheek, healing well with no signs of infection.   Jeannett Senior, PA-C 12/05/15 [place]  Elnora Morrison, MD 12/05/15 [phone]

## 2015-12-06 LAB — GC/CHLAMYDIA PROBE AMP (~~LOC~~) NOT AT ARMC
CHLAMYDIA, DNA PROBE: NEGATIVE
NEISSERIA GONORRHEA: NEGATIVE

## 2015-12-20 ENCOUNTER — Emergency Department (HOSPITAL_COMMUNITY): Payer: Commercial Managed Care - HMO

## 2015-12-20 ENCOUNTER — Encounter (HOSPITAL_COMMUNITY): Payer: Self-pay | Admitting: Emergency Medicine

## 2015-12-20 ENCOUNTER — Emergency Department (HOSPITAL_COMMUNITY)
Admission: EM | Admit: 2015-12-20 | Discharge: 2015-12-20 | Disposition: A | Discharge status: Home / Self Care | Payer: Commercial Managed Care - HMO | Attending: Emergency Medicine | Admitting: Emergency Medicine

## 2015-12-20 DIAGNOSIS — S00412A Abrasion of left ear, initial encounter: Secondary | ICD-10-CM | POA: Diagnosis not present

## 2015-12-20 DIAGNOSIS — Y999 Unspecified external cause status: Secondary | ICD-10-CM | POA: Insufficient documentation

## 2015-12-20 DIAGNOSIS — Y939 Activity, unspecified: Secondary | ICD-10-CM | POA: Diagnosis not present

## 2015-12-20 DIAGNOSIS — W1789XA Other fall from one level to another, initial encounter: Secondary | ICD-10-CM | POA: Insufficient documentation

## 2015-12-20 DIAGNOSIS — S20312A Abrasion of left front wall of thorax, initial encounter: Secondary | ICD-10-CM | POA: Diagnosis not present

## 2015-12-20 DIAGNOSIS — R0600 Dyspnea, unspecified: Secondary | ICD-10-CM | POA: Insufficient documentation

## 2015-12-20 DIAGNOSIS — S4992XA Unspecified injury of left shoulder and upper arm, initial encounter: Secondary | ICD-10-CM | POA: Diagnosis not present

## 2015-12-20 DIAGNOSIS — S40811A Abrasion of right upper arm, initial encounter: Secondary | ICD-10-CM | POA: Insufficient documentation

## 2015-12-20 DIAGNOSIS — S30810A Abrasion of lower back and pelvis, initial encounter: Secondary | ICD-10-CM | POA: Diagnosis not present

## 2015-12-20 DIAGNOSIS — Y929 Unspecified place or not applicable: Secondary | ICD-10-CM | POA: Diagnosis not present

## 2015-12-20 DIAGNOSIS — S80212A Abrasion, left knee, initial encounter: Secondary | ICD-10-CM | POA: Diagnosis not present

## 2015-12-20 DIAGNOSIS — S90511A Abrasion, right ankle, initial encounter: Secondary | ICD-10-CM | POA: Insufficient documentation

## 2015-12-20 DIAGNOSIS — N898 Other specified noninflammatory disorders of vagina: Secondary | ICD-10-CM | POA: Diagnosis not present

## 2015-12-20 DIAGNOSIS — S90512A Abrasion, left ankle, initial encounter: Secondary | ICD-10-CM | POA: Insufficient documentation

## 2015-12-20 DIAGNOSIS — S0990XA Unspecified injury of head, initial encounter: Secondary | ICD-10-CM | POA: Insufficient documentation

## 2015-12-20 DIAGNOSIS — S80211A Abrasion, right knee, initial encounter: Secondary | ICD-10-CM | POA: Insufficient documentation

## 2015-12-20 DIAGNOSIS — S40812A Abrasion of left upper arm, initial encounter: Secondary | ICD-10-CM | POA: Insufficient documentation

## 2015-12-20 DIAGNOSIS — M25512 Pain in left shoulder: Secondary | ICD-10-CM | POA: Diagnosis present

## 2015-12-20 DIAGNOSIS — W19XXXA Unspecified fall, initial encounter: Secondary | ICD-10-CM

## 2015-12-20 DIAGNOSIS — T07XXXA Unspecified multiple injuries, initial encounter: Secondary | ICD-10-CM

## 2015-12-20 DIAGNOSIS — R109 Unspecified abdominal pain: Secondary | ICD-10-CM | POA: Insufficient documentation

## 2015-12-20 LAB — BASIC METABOLIC PANEL
ANION GAP: 6 (ref 5–15)
BUN: 10 mg/dL (ref 6–20)
CALCIUM: 8.1 mg/dL — AB (ref 8.9–10.3)
CO2: 21 mmol/L — ABNORMAL LOW (ref 22–32)
Chloride: 108 mmol/L (ref 101–111)
Creatinine, Ser: 0.63 mg/dL (ref 0.44–1.00)
GFR calc Af Amer: >60 mL/min (ref >60)
Glucose, Bld: 100 mg/dL — ABNORMAL HIGH (ref 65–99)
POTASSIUM: 3.5 mmol/L (ref 3.5–5.1)
SODIUM: 135 mmol/L (ref 135–145)

## 2015-12-20 LAB — HCG, SERUM, QUALITATIVE: PREG SERUM: NEGATIVE

## 2015-12-20 LAB — CBC WITH DIFFERENTIAL/PLATELET
BASOS ABS: 0 10*3/uL (ref 0.0–0.1)
BASOS PCT: 0 %
EOS ABS: 0 10*3/uL (ref 0.0–0.7)
EOS PCT: 0 %
HCT: 36.9 % (ref 36.0–46.0)
Hemoglobin: 12.1 g/dL (ref 12.0–15.0)
Lymphocytes Relative: 11 %
Lymphs Abs: 1 10*3/uL (ref 0.7–4.0)
MCH: 27.1 pg (ref 26.0–34.0)
MCHC: 32.8 g/dL (ref 30.0–36.0)
MCV: 82.7 fL (ref 78.0–100.0)
Monocytes Absolute: 0.4 10*3/uL (ref 0.1–1.0)
Monocytes Relative: 4 %
Neutro Abs: 7.8 10*3/uL — ABNORMAL HIGH (ref 1.7–7.7)
Neutrophils Relative %: 85 %
PLATELETS: 163 10*3/uL (ref 150–400)
RBC: 4.46 MIL/uL (ref 3.87–5.11)
RDW: 13.3 % (ref 11.5–15.5)
WBC: 9.2 10*3/uL (ref 4.0–10.5)

## 2015-12-20 LAB — URINE MICROSCOPIC-ADD ON

## 2015-12-20 LAB — URINALYSIS, ROUTINE W REFLEX MICROSCOPIC
BILIRUBIN URINE: NEGATIVE
Glucose, UA: NEGATIVE mg/dL
KETONES UR: NEGATIVE mg/dL
Leukocytes, UA: NEGATIVE
Nitrite: NEGATIVE
PROTEIN: NEGATIVE mg/dL
Specific Gravity, Urine: 1.02 (ref 1.005–1.030)
pH: 6.5 (ref 5.0–8.0)

## 2015-12-20 MED ORDER — IOPAMIDOL (ISOVUE-300) INJECTION 61%
100.0000 mL | Freq: Once | INTRAVENOUS | Status: AC | PRN
  Administered 2015-12-20: 100 mL via INTRAVENOUS

## 2015-12-20 MED ORDER — FENTANYL CITRATE (PF) 100 MCG/2ML IJ SOLN
50.0000 ug | Freq: Once | INTRAMUSCULAR | Status: AC
  Administered 2015-12-20: 50 ug via INTRAVENOUS

## 2015-12-20 MED ORDER — KETOROLAC TROMETHAMINE 30 MG/ML IJ SOLN
30.0000 mg | Freq: Once | INTRAMUSCULAR | Status: AC
  Administered 2015-12-20: 30 mg via INTRAVENOUS
  Filled 2015-12-20: qty 1

## 2015-12-20 MED ORDER — FENTANYL CITRATE (PF) 100 MCG/2ML IJ SOLN
INTRAMUSCULAR | Status: DC
Start: 2015-12-20 — End: 2015-12-21
  Filled 2015-12-20: qty 2

## 2015-12-20 MED ORDER — FENTANYL CITRATE (PF) 100 MCG/2ML IJ SOLN
50.0000 ug | Freq: Once | INTRAMUSCULAR | Status: AC
  Administered 2015-12-20: 50 ug via INTRAVENOUS
  Filled 2015-12-20: qty 2

## 2015-12-20 MED ORDER — SODIUM CHLORIDE 0.9 % IV BOLUS (SEPSIS)
1000.0000 mL | Freq: Once | INTRAVENOUS | Status: AC
  Administered 2015-12-20: 1000 mL via INTRAVENOUS

## 2015-12-20 MED ORDER — NAPROXEN 500 MG PO TABS: 500.0000 mg | ORAL_TABLET | Freq: Two times a day (BID) | ORAL | Status: DC

## 2015-12-20 MED ORDER — OXYCODONE-ACETAMINOPHEN 5-325 MG PO TABS: 1.0000 | ORAL_TABLET | Freq: Four times a day (QID) | ORAL | Status: DC | PRN

## 2015-12-20 MED ORDER — ONDANSETRON HCL 4 MG/2ML IJ SOLN
4.0000 mg | Freq: Once | INTRAMUSCULAR | Status: AC
  Administered 2015-12-20: 4 mg via INTRAVENOUS
  Filled 2015-12-20: qty 2

## 2015-12-20 MED ORDER — SODIUM CHLORIDE 0.9 % IV SOLN
INTRAVENOUS | Status: DC
  Administered 2015-12-20: 21:00:00 via INTRAVENOUS

## 2015-12-20 MED ORDER — FENTANYL CITRATE (PF) 100 MCG/2ML IJ SOLN
INTRAMUSCULAR | Status: AC
  Filled 2015-12-20: qty 2

## 2015-12-20 NOTE — ED Notes (Signed)
Pt removed from [facility] by this RN,EDP and other ED RN.   Pt mother now at bedside.
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
## 2015-12-20 NOTE — Discharge Instructions (Signed)
Wound care as we discussed. Wash wounds daily with soap and water and apply antibiotic ointment like Neosporin and Xeroform gauze. Follow-up with orthopedics for the shoulder injury. Sling to the left shoulder arm area as needed for comfort. Work note provided. Return for any new or worse symptoms.  Can also call general surgery Dr. Rosana Hoes for appointment for wound care.

## 2015-12-25 MED FILL — Hydrocodone-Acetaminophen Tab 5-325 MG: ORAL | Qty: 6 | Status: AC

## 2016-04-24 ENCOUNTER — Ambulatory Visit (INDEPENDENT_AMBULATORY_CARE_PROVIDER_SITE_OTHER): Payer: Commercial Managed Care - HMO | Admitting: Adult Health

## 2016-04-24 ENCOUNTER — Encounter: Payer: Self-pay | Admitting: Adult Health

## 2016-04-24 VITALS — BP 110/80 | HR 76 | Ht 64.0 in | Wt 112.0 lb

## 2016-04-24 DIAGNOSIS — N898 Other specified noninflammatory disorders of vagina: Secondary | ICD-10-CM

## 2016-04-24 DIAGNOSIS — Z30011 Encounter for initial prescription of contraceptive pills: Secondary | ICD-10-CM

## 2016-04-24 MED ORDER — NORGESTIMATE-ETH ESTRADIOL 0.25-35 MG-MCG PO TABS: 1.0000 | ORAL_TABLET | Freq: Every day | ORAL | 11 refills | Status: DC

## 2016-04-24 NOTE — Progress Notes (Signed)
Subjective:     Patient ID: Tiffany Pugh, female   DOB: August 20, 1995, 20 y.o.   MRN: [id number]  HPI [name] is a 20 year old white female in complaining of vaginal dryness and she thinks it is related to the depo.Her last depo was last year.She also says periods are lite.  Review of Systems + vaginal dryness Lite periods Reviewed past medical,surgical, social and family history. Reviewed medications and allergies.     Objective:   Physical Exam BP 110/80   Pulse 76   Ht 5\' 4"  (1.626 m)   Wt 112 lb (50.8 kg)   LMP 04/12/2016   BMI 19.22 kg/m  Skin warm and dry. Lungs: clear to ausculation bilaterally. Cardiovascular: regular rate and rhythm.  PHQ 2 score 0.   Assessment:     1. Encounter for initial prescription of contraceptive pills   2. Vaginal dryness       Plan:     Rx sprintec disp # 1 pack, take 1 daily, with 11 refills,start with next period Follow up in 2 months

## 2016-04-24 NOTE — Patient Instructions (Signed)
Start sprintec with next period Follow up with me in 2 months

## 2016-06-25 ENCOUNTER — Other Ambulatory Visit: Payer: Self-pay | Admitting: Adult Health

## 2016-06-25 ENCOUNTER — Other Ambulatory Visit: Payer: Commercial Managed Care - HMO

## 2016-06-25 ENCOUNTER — Ambulatory Visit: Payer: Commercial Managed Care - HMO | Admitting: Adult Health

## 2016-06-25 DIAGNOSIS — Z113 Encounter for screening for infections with a predominantly sexual mode of transmission: Secondary | ICD-10-CM

## 2016-06-25 NOTE — Progress Notes (Signed)
She forgot appt this am, but is worried that boyfriend cheated and wants STD screening, will get those and get appt rescheduled

## 2016-06-26 ENCOUNTER — Telehealth: Payer: Self-pay | Admitting: Adult Health

## 2016-06-26 LAB — RPR: RPR: NONREACTIVE

## 2016-06-26 LAB — HIV ANTIBODY (ROUTINE TESTING W REFLEX): HIV SCREEN 4TH GENERATION: NONREACTIVE

## 2016-06-26 NOTE — Telephone Encounter (Signed)
Pt aware HIV and RPR negative

## 2016-06-27 LAB — GC/CHLAMYDIA PROBE AMP
Chlamydia trachomatis, NAA: NEGATIVE
NEISSERIA GONORRHOEAE BY PCR: NEGATIVE

## 2016-06-28 LAB — GC/CHLAMYDIA PROBE AMP

## 2016-08-26 DIAGNOSIS — E440 Moderate protein-calorie malnutrition: Secondary | ICD-10-CM | POA: Diagnosis not present

## 2016-12-26 DIAGNOSIS — E440 Moderate protein-calorie malnutrition: Secondary | ICD-10-CM | POA: Diagnosis not present

## 2017-01-08 ENCOUNTER — Emergency Department (HOSPITAL_COMMUNITY)
Admission: EM | Admit: 2017-01-08 | Discharge: 2017-01-08 | Disposition: A | Discharge status: Home / Self Care | Payer: 59 | Attending: Emergency Medicine | Admitting: Emergency Medicine

## 2017-01-08 ENCOUNTER — Encounter (HOSPITAL_COMMUNITY): Payer: Self-pay | Admitting: Emergency Medicine

## 2017-01-08 DIAGNOSIS — S39012A Strain of muscle, fascia and tendon of lower back, initial encounter: Secondary | ICD-10-CM | POA: Diagnosis not present

## 2017-01-08 DIAGNOSIS — Y33XXXA Other specified events, undetermined intent, initial encounter: Secondary | ICD-10-CM | POA: Insufficient documentation

## 2017-01-08 DIAGNOSIS — Y999 Unspecified external cause status: Secondary | ICD-10-CM | POA: Insufficient documentation

## 2017-01-08 DIAGNOSIS — Y929 Unspecified place or not applicable: Secondary | ICD-10-CM | POA: Diagnosis not present

## 2017-01-08 DIAGNOSIS — Z79899 Other long term (current) drug therapy: Secondary | ICD-10-CM | POA: Insufficient documentation

## 2017-01-08 DIAGNOSIS — Y939 Activity, unspecified: Secondary | ICD-10-CM | POA: Insufficient documentation

## 2017-01-08 DIAGNOSIS — S3992XA Unspecified injury of lower back, initial encounter: Secondary | ICD-10-CM | POA: Diagnosis not present

## 2017-01-08 LAB — URINALYSIS, ROUTINE W REFLEX MICROSCOPIC
BILIRUBIN URINE: NEGATIVE
Glucose, UA: NEGATIVE mg/dL
HGB URINE DIPSTICK: NEGATIVE
KETONES UR: NEGATIVE mg/dL
Leukocytes, UA: NEGATIVE
Nitrite: NEGATIVE
PROTEIN: NEGATIVE mg/dL
Specific Gravity, Urine: 1.026 (ref 1.005–1.030)
pH: 5 (ref 5.0–8.0)

## 2017-01-08 LAB — PREGNANCY, URINE: PREG TEST UR: NEGATIVE

## 2017-01-08 MED ORDER — CYCLOBENZAPRINE HCL 5 MG PO TABS: 5.0000 mg | ORAL_TABLET | Freq: Two times a day (BID) | ORAL | 0 refills | Status: DC

## 2017-01-08 MED ORDER — IBUPROFEN 600 MG PO TABS: 600.0000 mg | ORAL_TABLET | Freq: Four times a day (QID) | ORAL | 0 refills | Status: DC | PRN

## 2017-01-08 NOTE — ED Triage Notes (Signed)
Pt c/o lower back pain x 2 days. Pt also c/o cloudy urine.

## 2017-01-08 NOTE — Discharge Instructions (Signed)
Alternate ice and heat to your back.  Follow-up with your doctor in one week if the symptoms are not improving.  Return here for any worsening symptoms such as fever, vomiting or increasing pain

## 2017-01-12 NOTE — ED Provider Notes (Signed)
[facility] DEPT Provider Note   CSN: [id number] Arrival date & time: 01/08/17  2122     History   Chief Complaint Chief Complaint  Patient presents with  . Back Pain    HPI Tiffany Pugh is a 21 y.o. female.  HPI   Tiffany Pugh is a 21 y.o. female who presents to the Emergency Department complaining of lower back pain for 2 days.  Describes aching pain associated with bending and walking.  Pain improves at rest.  No injury.  Also reports cloudy appearing urine for one day.  She denies urinary frequency, burning, vaginal bleeding or discharge and abd pain. No numbness, pain, or weakness of the lower extremities.   Past Medical History:  Diagnosis Date  . BV (bacterial vaginosis) 11/08/2015  . Decreased libido 11/08/2015  . Vaginal discharge 11/08/2015  . Vaginal dryness 11/08/2015    Patient Active Problem List   Diagnosis Date Noted  . Vaginal discharge 11/08/2015  . BV (bacterial vaginosis) 11/08/2015  . Vaginal dryness 11/08/2015  . Decreased libido 11/08/2015    Past Surgical History:  Procedure Laterality Date  . CLEFT PALATE REPAIR    . CLEFT PALATE REPAIR    . lafort ostomy    . RHINOPLASTY  08/28/15  . WISDOM TOOTH EXTRACTION      OB History    Gravida Para Term Preterm AB Living   0 0 0 0 0 0   SAB TAB Ectopic Multiple Live Births   0 0 0 0 0       Home Medications    Prior to Admission medications   Medication Sig Start Date End Date Taking? Authorizing Provider  cyclobenzaprine (FLEXERIL) 5 MG tablet Take 1 tablet (5 mg total) by mouth 2 (two) times daily with a meal. 01/08/17   [place] Dani, PA-C  ibuprofen (ADVIL,MOTRIN) 600 MG tablet Take 1 tablet (600 mg total) by mouth every 6 (six) hours as needed. Take with food 01/08/17   Ryzen Deady, PA-C  mirtazapine (REMERON) 15 MG tablet Take 15 mg by mouth at bedtime.    [provider]  norgestimate-ethinyl estradiol (ORTHO-CYCLEN,SPRINTEC,PREVIFEM) 0.25-35 MG-MCG tablet Take 1  tablet by mouth daily. 04/24/16   Estill Dooms, NP    Family History Family History  Problem Relation Age of Onset  . Stroke Maternal Grandfather     Social History Social History  Substance Use Topics  . Smoking status: Never Smoker  . Smokeless tobacco: Never Used  . Alcohol use Yes     Comment: occ     Allergies   Bee venom and Hydromorphone   Review of Systems Review of Systems  Constitutional: Negative for fever.  Respiratory: Negative for shortness of breath.   Gastrointestinal: Negative for abdominal pain, constipation and vomiting.  Genitourinary: Negative for decreased urine volume, difficulty urinating, dysuria, flank pain and hematuria.  Musculoskeletal: Positive for back pain. Negative for joint swelling.  Skin: Negative for rash.  Neurological: Negative for weakness and numbness.  All other systems reviewed and are negative.    Physical Exam Updated Vital Signs BP 136/87 (BP Location: Left Arm)   Pulse 80   Temp 98.1 F (36.7 C) (Oral)   Resp 16   Ht 5\' 4"  (1.626 m)   Wt 47.6 kg (105 lb)   LMP 12/13/2016   SpO2 98%   BMI 18.02 kg/m   Physical Exam  Constitutional: She is oriented to person, place, and time. She appears well-developed and well-nourished. No distress.  HENT:  Head: Normocephalic and atraumatic.  Neck: Normal range of motion. Neck supple.  Cardiovascular: Normal rate, regular rhythm, normal heart sounds and intact distal pulses.   No murmur heard. Pulmonary/Chest: Effort normal and breath sounds normal. No respiratory distress.  Abdominal: Soft. She exhibits no distension. There is no tenderness.  Musculoskeletal: She exhibits tenderness. She exhibits no edema.       Lumbar back: She exhibits tenderness and pain. She exhibits normal range of motion, no swelling, no deformity, no laceration and normal pulse.  ttp of the bilateral  lumbar paraspinal muscles.  No spinal tenderness.    Pt has 5/5 strength against resistance of  bilateral lower extremities.     Neurological: She is alert and oriented to person, place, and time. She has normal strength. No sensory deficit. She exhibits normal muscle tone. Coordination and gait normal.  Reflex Scores:      Patellar reflexes are 2+ on the right side and 2+ on the left side.      Achilles reflexes are 2+ on the right side and 2+ on the left side. Skin: Skin is warm and dry. No rash noted.  Nursing note and vitals reviewed.    ED Treatments / Results  Labs (all labs ordered are listed, but only abnormal results are displayed) Labs Reviewed  URINALYSIS, ROUTINE W REFLEX MICROSCOPIC  PREGNANCY, URINE    EKG  EKG Interpretation None       Radiology No results found.  Procedures Procedures (including critical care time)  Medications Ordered in ED Medications - No data to display   Initial Impression / Assessment and Plan / ED Course  I have reviewed the triage vital signs and the nursing notes.  Pertinent labs & imaging results that were available during my care of the patient were reviewed by me and considered in my medical decision making (see chart for details).     U/a reassuring.  Likely strain of lumbar region.  No focal neuro deficits, ambulates with steady gait.  Agrees to symptomatic tx with flexeril and ibuprofen., PCP f/u   Final Clinical Impressions(s) / ED Diagnoses   Final diagnoses:  Strain of lumbar region, initial encounter    New Prescriptions Discharge Medication List as of 01/08/2017 10:52 PM    START taking these medications   Details  cyclobenzaprine (FLEXERIL) 5 MG tablet Take 1 tablet (5 mg total) by mouth 2 (two) times daily with a meal., Starting Wed 01/08/2017, Print    ibuprofen (ADVIL,MOTRIN) 600 MG tablet Take 1 tablet (600 mg total) by mouth every 6 (six) hours as needed. Take with food, Starting Wed 01/08/2017, Print         [place], [place], PA-C 01/12/17 0100    Julianne Rice, MD 01/16/17 [phone]

## 2017-01-31 IMAGING — CT CT CERVICAL SPINE W/O CM
5 of 8 series · 12 of 33 positions shown, 13 images · non-contrast
Comparison: None.

CLINICAL DATA: Fall with shoulder pain.  Initial encounter.

EXAM:
CT HEAD WITHOUT CONTRAST
CT CERVICAL SPINE WITHOUT CONTRAST
TECHNIQUE: Multidetector CT imaging of the head and cervical spine was
performed following the standard protocol without intravenous
contrast. Multiplanar CT image reconstructions of the cervical spine
were also generated.

[Series 3: head bone · axial · 0.40mm/px · z∈[+198,+248]mm · 2 of 76 slices shown]
[im 26/76  bone]
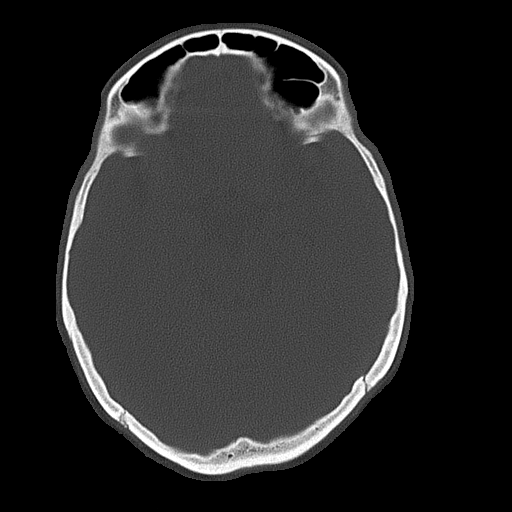
[im 51/76  bone]
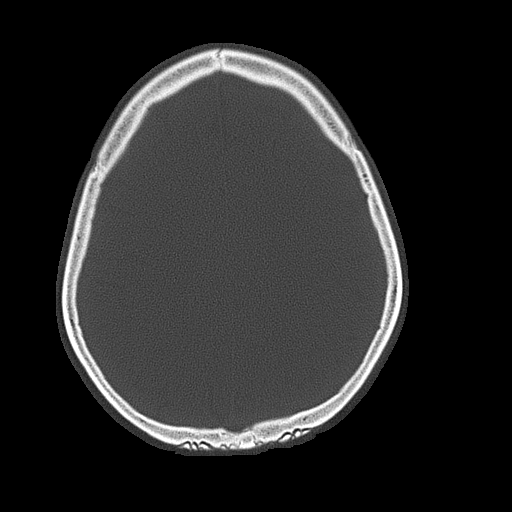

[Series 7: c spine soft · axial · 0.28mm/px · z∈[+72,+130]mm · 2 of 88 slices shown]
[im 30/88  soft-tissue]
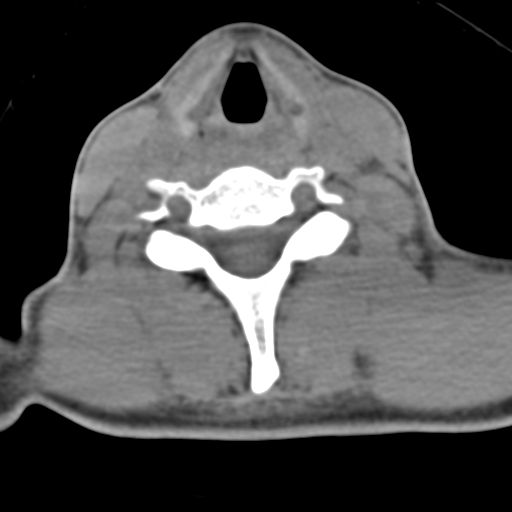
[im 59/88  soft-tissue]
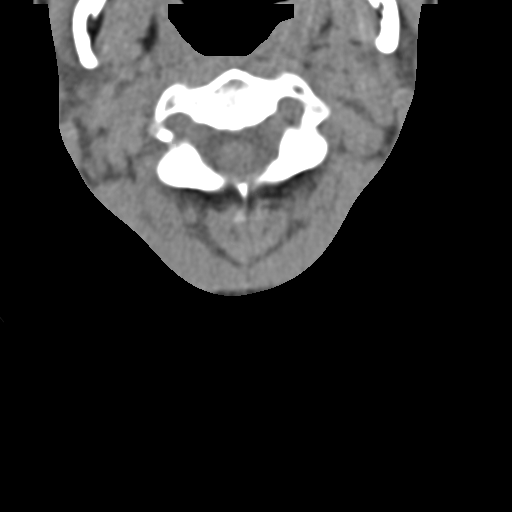

[Series 8: sagittal bone · sagittal · 0.19mm/px · 5 of 56 slices shown]
[im 10/56  bone]
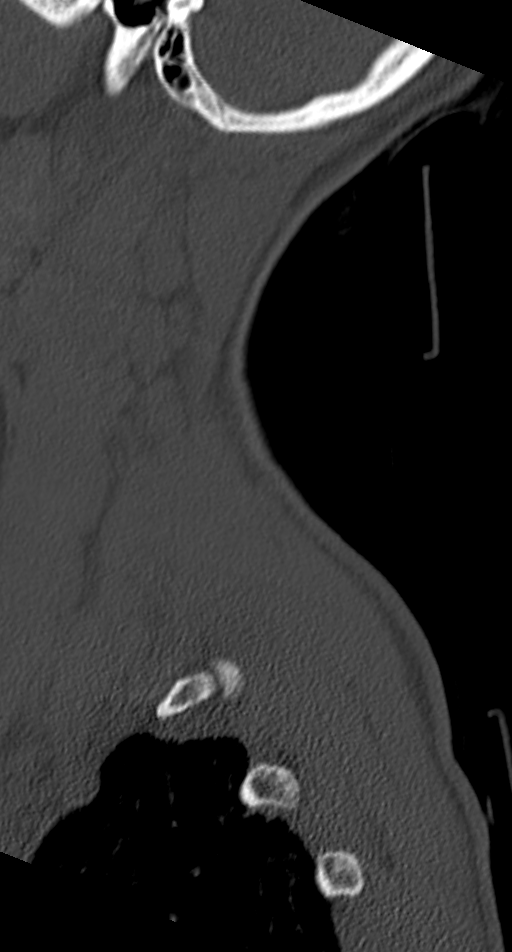
[im 19/56  bone]
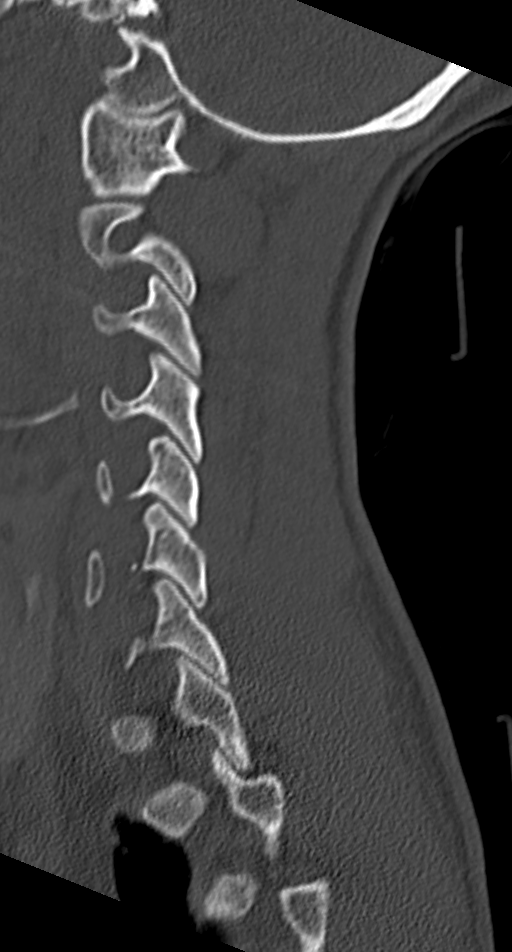
[im 28/56  bone]
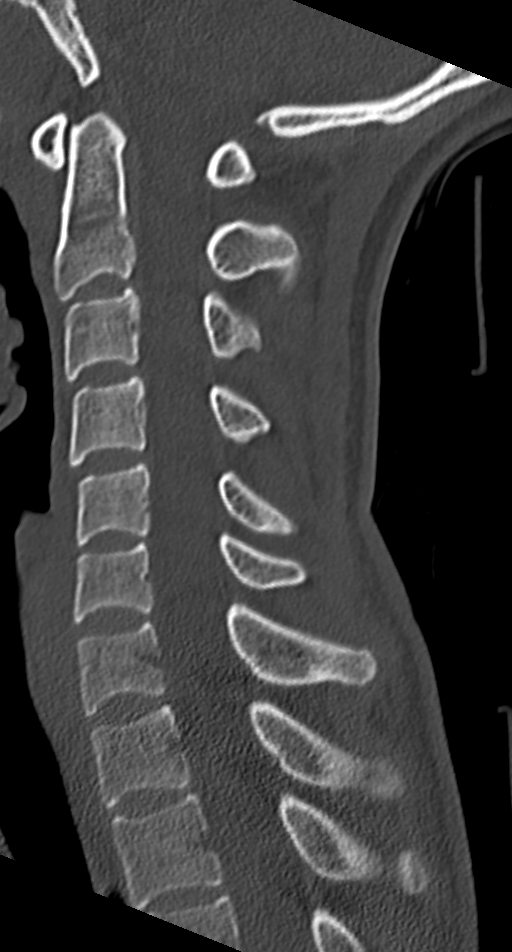
[im 37/56  bone]
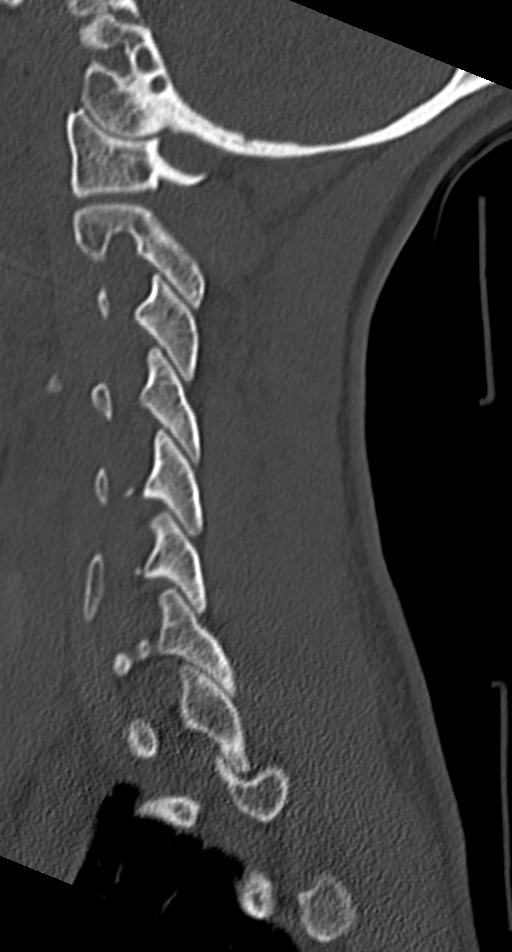
[im 46/56  bone]
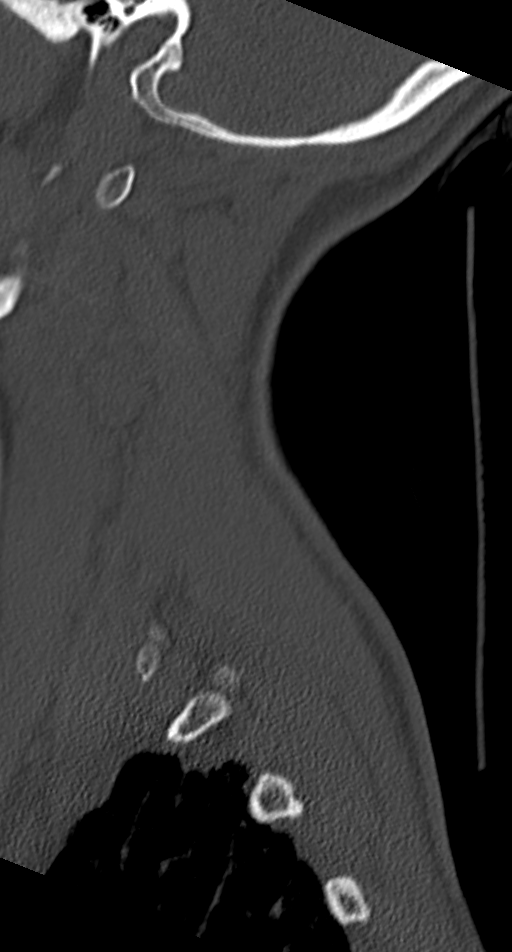

[Series 9: coronal bone · coronal · 0.23mm/px · 1 of 48 slices shown]
[im 24/48  bone]
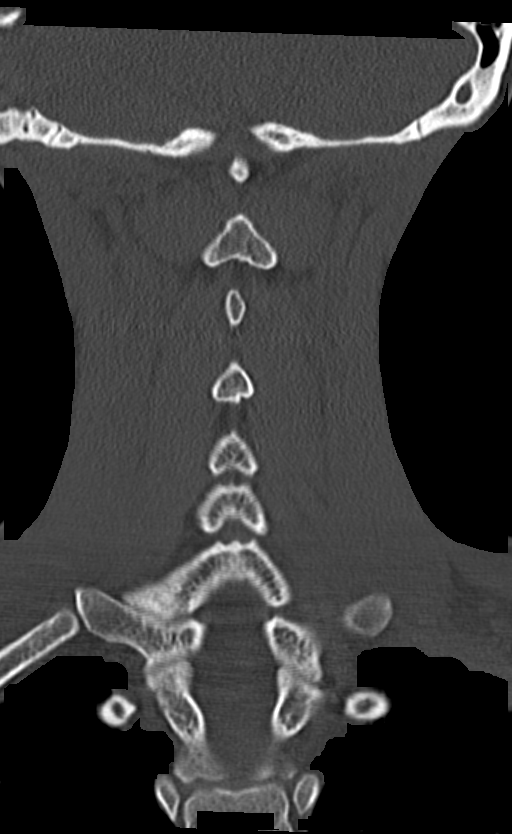

[Series 10: orthogonal axial · axial · 0.22mm/px · z∈[+58,+112]mm · 2 of 87 slices shown, 3 images]
[im 29/87  soft-tissue]
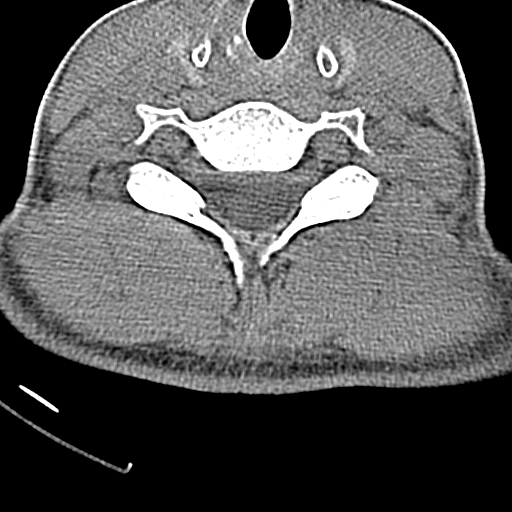
[im 29/87  bone]
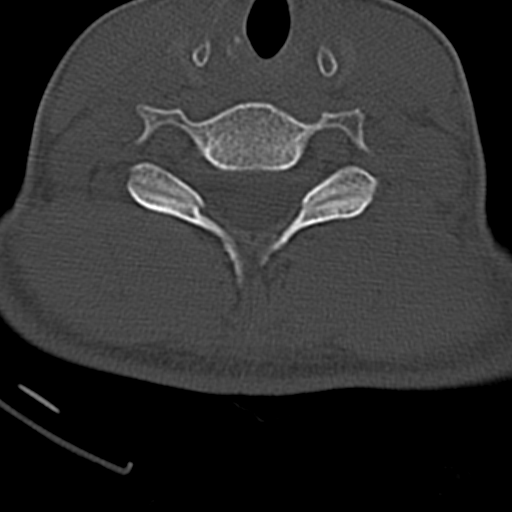
[im 58/87  bone]
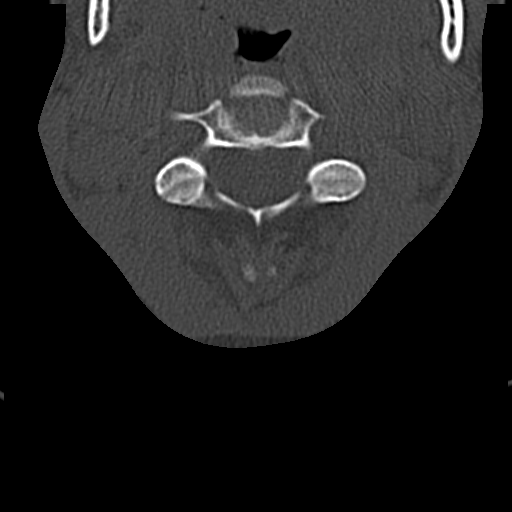

[12 of 33 positions shown; findings below may reference images not displayed]

FINDINGS: CT HEAD FINDINGS

Brain: Normal. No evidence of acute infarction, hemorrhage,
hydrocephalus, or mass lesion/mass effect.

Vascular:  Negative.

Skull: Negative for fracture or focal lesion.

Sinuses/Orbits: Remote facial injury with mid face fixation that is
partly visualized. There has been a remote left pterygoid process
fracture. Mucosal thickening partly seen and the nasal cavity and
paranasal sinuses. No hemo sinus.

Other: None.

CT CERVICAL SPINE FINDINGS

Alignment: No traumatic malalignment.

Skull base and vertebrae: No  acute fracture. No aggressive process.

Soft tissues and canal: No prevertebral fluid. No gross canal
hematoma.

Degenerative: Negative for age.
IMPRESSION: No evidence of intracranial or cervical spine injury.
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
## 2017-03-27 DIAGNOSIS — Z23 Encounter for immunization: Secondary | ICD-10-CM | POA: Diagnosis not present

## 2017-05-25 ENCOUNTER — Emergency Department (HOSPITAL_COMMUNITY)
Admission: EM | Admit: 2017-05-25 | Discharge: 2017-05-25 | Disposition: A | Discharge status: Home / Self Care | Payer: 59 | Attending: Emergency Medicine | Admitting: Emergency Medicine

## 2017-05-25 ENCOUNTER — Encounter (HOSPITAL_COMMUNITY): Payer: Self-pay | Admitting: Emergency Medicine

## 2017-05-25 ENCOUNTER — Other Ambulatory Visit: Payer: Self-pay

## 2017-05-25 DIAGNOSIS — J029 Acute pharyngitis, unspecified: Secondary | ICD-10-CM | POA: Diagnosis not present

## 2017-05-25 DIAGNOSIS — R05 Cough: Secondary | ICD-10-CM | POA: Diagnosis not present

## 2017-05-25 DIAGNOSIS — R21 Rash and other nonspecific skin eruption: Secondary | ICD-10-CM | POA: Diagnosis not present

## 2017-05-25 DIAGNOSIS — B9689 Other specified bacterial agents as the cause of diseases classified elsewhere: Secondary | ICD-10-CM | POA: Diagnosis not present

## 2017-05-25 DIAGNOSIS — R059 Cough, unspecified: Secondary | ICD-10-CM

## 2017-05-25 DIAGNOSIS — Z79899 Other long term (current) drug therapy: Secondary | ICD-10-CM | POA: Insufficient documentation

## 2017-05-25 DIAGNOSIS — N760 Acute vaginitis: Secondary | ICD-10-CM | POA: Insufficient documentation

## 2017-05-25 DIAGNOSIS — N898 Other specified noninflammatory disorders of vagina: Secondary | ICD-10-CM | POA: Diagnosis present

## 2017-05-25 LAB — URINALYSIS, ROUTINE W REFLEX MICROSCOPIC
BILIRUBIN URINE: NEGATIVE
GLUCOSE, UA: NEGATIVE mg/dL
Hgb urine dipstick: NEGATIVE
KETONES UR: NEGATIVE mg/dL
Leukocytes, UA: NEGATIVE
NITRITE: NEGATIVE
PH: 6 (ref 5.0–8.0)
PROTEIN: NEGATIVE mg/dL
Specific Gravity, Urine: 1.016 (ref 1.005–1.030)

## 2017-05-25 LAB — WET PREP, GENITAL
Sperm: NONE SEEN
Trich, Wet Prep: NONE SEEN
Yeast Wet Prep HPF POC: NONE SEEN

## 2017-05-25 LAB — RAPID STREP SCREEN (MED CTR MEBANE ONLY): STREPTOCOCCUS, GROUP A SCREEN (DIRECT): NEGATIVE

## 2017-05-25 MED ORDER — METRONIDAZOLE 500 MG PO TABS: 500.0000 mg | ORAL_TABLET | Freq: Two times a day (BID) | ORAL | 0 refills | Status: DC

## 2017-05-25 NOTE — ED Notes (Signed)
2 week hx of sore throat, also w dry cough  Did not take OTC meds to not mask sx  Sister dx'd with strep and pt wants to make sure she doe not progress to strep  Pt is clear voiced, non smoker, with pink throat

## 2017-05-25 NOTE — ED Notes (Signed)
PA in to assess 

## 2017-05-25 NOTE — ED Triage Notes (Signed)
Patient c/o sore throat with nonproductive cough and rash to chest. Denies any fevers. Per patient symptoms started 2 weeks ago. Sister being treated for strep.

## 2017-05-25 NOTE — Discharge Instructions (Signed)
Please take 1 tablet of metronidazole twice daily for the next 7 days.  It is extremely important not to drink alcohol while you are taking this medication because it can cause projectile vomiting.  This medication is safe to take with your other medications.  Your test for strep throat was negative today.  The sore throat is most likely related to your cough.  As her cough improves, your sore throat should continue to improve.  Drinking warm and cold liquids can help to improve the pain.  You can also try over-the-counter products such as Chloraseptic or benzocaine drops.    Apply a non-fragrance lotion to rash on your chest.  If it does not start to improve in the next week, you can also try an over-the-counter product like hydrocortisone cream.  Sometimes wearing material such as wall can also cause a similar rash.  If the rash worsens, you can call the number on your discharge paperwork to get established with a new primary care provider or follow-up with your current provider.  If you develop any new or worsening symptoms, including fever, chills, severe pelvic pain, difficulty swallowing or breathing, please return to the emergency department for reevaluation.

## 2017-05-25 NOTE — ED Triage Notes (Signed)
Patient also requesting possible treatment for BV or yeast infection.

## 2017-05-25 NOTE — ED Notes (Signed)
Pt states to PA  Rash Pelvic itching etc  Request from PA to change triage level to 3  Pelvic tray to room

## 2017-05-25 NOTE — ED Provider Notes (Signed)
[facility] EMERGENCY DEPARTMENT Provider Note   CSN: [id number] Arrival date & time: 05/25/17  1555     History   Chief Complaint Chief Complaint  Patient presents with  . Sore Throat  . Rash  . Pelvic Pain    HPI Tiffany Pugh is a 21 y.o. female who presents to the emergency department with multiple complaints.  She reports a cough that began 2 weeks ago that was initially productive with clear sputum and has since become nonproductive in the last week.  She also reports an associated sore throat, HA, and cervical lymphadenopathy.   She denies fever, chills, trismus, drooling, muffled voice, nasal congestion, sinus pain or pressure, shortness of breath, chest pain, or neck pain or stiffness.  She reports that her sister was recently diagnosed with strep throat and that she is concerned that her sore throat may be strep throat as well.  She reports that her sore throat is aggravated with coughing and alleviated by nothing.  She also reports dysuria for the last 2 weeks with associated vaginal itching.  She denies vaginal discharge or pain.  No sexual intercourse in the last 2-3 months and was previously active with one female partner. LMP 04/30/17.   She also reports a pruritic rash to the chest and bilateral arms that began last week.  She recently changed detergents approximately 1 month ago and also purchase new fragrant lotions around the same time.  She reports that she has been wearing more wool sweaters over the last week.  No treatment prior to arrival.  No aggravating or alleviating.  The rashes remain unchanged since onset.  The history is provided by the patient. No language interpreter was used.  Sore Throat  Associated symptoms include headaches. Pertinent negatives include no chest pain and no shortness of breath.    Past Medical History:  Diagnosis Date  . BV (bacterial vaginosis) 11/08/2015  . Decreased libido 11/08/2015  . Vaginal discharge 11/08/2015  . Vaginal  dryness 11/08/2015    Patient Active Problem List   Diagnosis Date Noted  . Vaginal discharge 11/08/2015  . BV (bacterial vaginosis) 11/08/2015  . Vaginal dryness 11/08/2015  . Decreased libido 11/08/2015    Past Surgical History:  Procedure Laterality Date  . CLEFT PALATE REPAIR    . CLEFT PALATE REPAIR    . lafort ostomy    . RHINOPLASTY  08/28/15  . WISDOM TOOTH EXTRACTION      OB History    Gravida Para Term Preterm AB Living   0 0 0 0 0 0   SAB TAB Ectopic Multiple Live Births   0 0 0 0 0       Home Medications    Prior to Admission medications   Medication Sig Start Date End Date Taking? Authorizing Provider  cyclobenzaprine (FLEXERIL) 5 MG tablet Take 1 tablet (5 mg total) by mouth 2 (two) times daily with a meal. 01/08/17   Triplett, Tammy, PA-C  ibuprofen (ADVIL,MOTRIN) 600 MG tablet Take 1 tablet (600 mg total) by mouth every 6 (six) hours as needed. Take with food 01/08/17   Triplett, Tammy, PA-C  metroNIDAZOLE (FLAGYL) 500 MG tablet Take 1 tablet (500 mg total) by mouth 2 (two) times daily. 05/25/17   Barlow Harrison A, PA-C  mirtazapine (REMERON) 15 MG tablet Take 15 mg by mouth at bedtime.    [provider]  norgestimate-ethinyl estradiol (ORTHO-CYCLEN,SPRINTEC,PREVIFEM) 0.25-35 MG-MCG tablet Take 1 tablet by mouth daily. 04/24/16   Estill Dooms,  NP    Family History Family History  Problem Relation Age of Onset  . Stroke Maternal Grandfather     Social History Social History   Tobacco Use  . Smoking status: Never Smoker  . Smokeless tobacco: Never Used  Substance Use Topics  . Alcohol use: Yes    Comment: occ  . Drug use: No     Allergies   Bee venom and Hydromorphone   Review of Systems Review of Systems  Constitutional: Negative for chills and fever.  HENT: Positive for sore throat. Negative for congestion, dental problem, drooling, ear pain, postnasal drip, rhinorrhea, sinus pressure, sinus pain, trouble swallowing and voice  change.   Respiratory: Positive for cough. Negative for shortness of breath.   Cardiovascular: Negative for chest pain.  Gastrointestinal: Negative for nausea and vomiting.  Genitourinary: Positive for dysuria. Negative for frequency and vaginal discharge.       Vaginal itching  Musculoskeletal: Negative for neck pain and neck stiffness.  Allergic/Immunologic: Negative for immunocompromised state.  Neurological: Positive for headaches.     Physical Exam Updated Vital Signs BP 126/76 (BP Location: Right Arm)   Pulse 89   Temp 98.2 F (36.8 C) (Oral)   Resp 18   Ht 5\' 4"  (1.626 m)   Wt 49 kg (108 lb)   LMP 04/30/2017   SpO2 100%   BMI 18.54 kg/m   Physical Exam  Constitutional: No distress.  HENT:  Head: Normocephalic.  Right Ear: Tympanic membrane normal.  Left Ear: Tympanic membrane and ear canal normal.  Nose: Rhinorrhea present. No mucosal edema.  Mouth/Throat: Uvula is midline and mucous membranes are normal. No trismus in the jaw. Posterior oropharyngeal erythema present. No oropharyngeal exudate, posterior oropharyngeal edema or tonsillar abscesses.  Cerumen noted in the right canal.  No occlusion.  Eyes: Conjunctivae are normal.  Neck: Normal range of motion. Neck supple.  Shotty anterior lymphadenopathy.  Cardiovascular: Normal rate, regular rhythm, normal heart sounds and intact distal pulses. Exam reveals no gallop and no friction rub.  No murmur heard. Pulmonary/Chest: Effort normal and breath sounds normal. No stridor. No respiratory distress. She has no wheezes. She has no rales. She exhibits no tenderness.  Abdominal: Soft. Bowel sounds are normal. She exhibits no distension and no mass. There is no tenderness. There is no rebound and no guarding. No hernia. Hernia confirmed negative in the right inguinal area and confirmed negative in the left inguinal area.  Genitourinary: Uterus normal. There is no rash, tenderness, lesion or injury on the right labia. There  is no rash, tenderness, lesion or injury on the left labia. Uterus is not enlarged and not tender. Cervix exhibits no motion tenderness, no discharge and no friability. Right adnexum displays no mass, no tenderness and no fullness. Left adnexum displays no mass, no tenderness and no fullness. Vaginal discharge found.  Genitourinary Comments: Chaperoned exam.  Clitoral piercing in place.  No signs of surrounding erythema, edema, or warmth.  No cervical motion tenderness.  No adnexal tenderness bilaterally.  No gross blood.  A small amount of thick, white vaginal discharge was noted in the vaginal vault.   Lymphadenopathy:    She has cervical adenopathy.  Neurological: She is alert.  Skin: Skin is warm. No rash noted. She is not diaphoretic.  Psychiatric: Her behavior is normal.  Nursing note and vitals reviewed.    ED Treatments / Results  Labs (all labs ordered are listed, but only abnormal results are displayed) Labs Reviewed  WET PREP,  GENITAL - Abnormal; Notable for the following components:      Result Value   Clue Cells Wet Prep HPF POC PRESENT (*)    WBC, Wet Prep HPF POC MANY (*)    All other components within normal limits  RAPID STREP SCREEN (NOT AT [facility])  CULTURE, GROUP A STREP ([place])  URINALYSIS, ROUTINE W REFLEX MICROSCOPIC  GC/CHLAMYDIA PROBE AMP ([place]) NOT AT [facility]    EKG  EKG Interpretation None       Radiology No results found.  Procedures Procedures (including critical care time)  Medications Ordered in ED Medications - No data to display   Initial Impression / Assessment and Plan / ED Course  I have reviewed the triage vital signs and the nursing notes.  Pertinent labs & imaging results that were available during my care of the patient were reviewed by me and considered in my medical decision making (see chart for details).     Patient presenting with multiple complaints including sore throat, nonproductive cough that has been improving over  the last 2 weeks, headache, rash, vaginal itching, and dysuria.  Pt understands that they have GC/Chlamydia cultures pending and that they will need to inform all sexual partners if results return positive. Pt not concerning for PID because hemodynamically stable and no cervical motion tenderness on pelvic exam.  Wet prep with clue cells, consistent with bacterial vaginosis.  We will treat the patient with Flagyl.  Confirmed that this medication has no adverse reactions with the Remeron that she is also taking. Pt has been advised to not drink alcohol while on this medication.   Urinalysis not consistent with UTI.  Rapid strep test is negative.  Physical exam, lungs are clear to auscultation laterally.  Chest x-ray is not indicated at this time.  Doubt pneumonia.  Discussed with the patient that the sore throat is likely secondary to the cough, and should improve as the cough improves.  Discussed supportive care measures.  The rash on her chest is not consistent with SJS, TENS, varicella-zoster, or measles.  More consistent with contact dermatitis she reports she has been wearing more will sweaters since the rash began. Discussed wearing a shirt directly on the skin, moisturizing, and OTC hydrocortisone cream.   Strict return precautions given.  VSS. No acute distress.  The patient is safe for discharge at this time.   Final Clinical Impressions(s) / ED Diagnoses   Final diagnoses:  Bacterial vaginosis  Cough  Rash and nonspecific skin eruption  Sore throat    ED Discharge Orders        Ordered    metroNIDAZOLE (FLAGYL) 500 MG tablet  2 times daily     05/25/17 1759       Mayur Duman, Laymond Purser, PA-C 05/25/17 1806    Mesner, Corene Cornea, MD 05/25/17 2327

## 2017-05-25 NOTE — ED Notes (Signed)
POs provided per pt request

## 2017-05-26 LAB — GC/CHLAMYDIA PROBE AMP (~~LOC~~) NOT AT ARMC
CHLAMYDIA, DNA PROBE: NEGATIVE
NEISSERIA GONORRHEA: NEGATIVE

## 2017-05-28 LAB — CULTURE, GROUP A STREP (THRC)

## 2017-09-10 DIAGNOSIS — E440 Moderate protein-calorie malnutrition: Secondary | ICD-10-CM | POA: Diagnosis not present

## 2017-09-10 DIAGNOSIS — J0101 Acute recurrent maxillary sinusitis: Secondary | ICD-10-CM | POA: Diagnosis not present

## 2017-12-11 DIAGNOSIS — D225 Melanocytic nevi of trunk: Secondary | ICD-10-CM | POA: Diagnosis not present

## 2017-12-11 DIAGNOSIS — Z1283 Encounter for screening for malignant neoplasm of skin: Secondary | ICD-10-CM | POA: Diagnosis not present

## 2017-12-11 DIAGNOSIS — D485 Neoplasm of uncertain behavior of skin: Secondary | ICD-10-CM | POA: Diagnosis not present

## 2018-06-02 ENCOUNTER — Telehealth: Payer: 59 | Admitting: Family Medicine

## 2018-06-02 DIAGNOSIS — N760 Acute vaginitis: Secondary | ICD-10-CM

## 2018-06-02 DIAGNOSIS — B9689 Other specified bacterial agents as the cause of diseases classified elsewhere: Secondary | ICD-10-CM

## 2018-06-02 MED ORDER — METRONIDAZOLE 500 MG PO TABS: 500.0000 mg | ORAL_TABLET | Freq: Two times a day (BID) | ORAL | 0 refills | Status: AC

## 2018-06-02 NOTE — Progress Notes (Signed)

## 2018-06-10 ENCOUNTER — Encounter (HOSPITAL_COMMUNITY): Payer: Self-pay | Admitting: Pharmacy Technician

## 2018-06-10 ENCOUNTER — Emergency Department (HOSPITAL_COMMUNITY)
Admission: EM | Admit: 2018-06-10 | Discharge: 2018-06-10 | Disposition: A | Discharge status: Home / Self Care | Payer: 59 | Attending: Emergency Medicine | Admitting: Emergency Medicine

## 2018-06-10 ENCOUNTER — Other Ambulatory Visit: Payer: Self-pay

## 2018-06-10 DIAGNOSIS — R110 Nausea: Secondary | ICD-10-CM | POA: Diagnosis not present

## 2018-06-10 DIAGNOSIS — I493 Ventricular premature depolarization: Secondary | ICD-10-CM | POA: Diagnosis not present

## 2018-06-10 DIAGNOSIS — T7840XA Allergy, unspecified, initial encounter: Secondary | ICD-10-CM

## 2018-06-10 DIAGNOSIS — Z79899 Other long term (current) drug therapy: Secondary | ICD-10-CM | POA: Insufficient documentation

## 2018-06-10 DIAGNOSIS — R000 Tachycardia, unspecified: Secondary | ICD-10-CM | POA: Diagnosis not present

## 2018-06-10 LAB — URINALYSIS, ROUTINE W REFLEX MICROSCOPIC
BILIRUBIN URINE: NEGATIVE
GLUCOSE, UA: NEGATIVE mg/dL
Hgb urine dipstick: NEGATIVE
KETONES UR: 5 mg/dL — AB
LEUKOCYTES UA: NEGATIVE
Nitrite: NEGATIVE
PROTEIN: NEGATIVE mg/dL
Specific Gravity, Urine: 1.009 (ref 1.005–1.030)
pH: 7 (ref 5.0–8.0)

## 2018-06-10 LAB — PREGNANCY, URINE: Preg Test, Ur: NEGATIVE

## 2018-06-10 MED ORDER — METHYLPREDNISOLONE SODIUM SUCC 125 MG IJ SOLR
125.0000 mg | Freq: Once | INTRAMUSCULAR | Status: AC
  Administered 2018-06-10: 125 mg via INTRAVENOUS
  Filled 2018-06-10: qty 2

## 2018-06-10 MED ORDER — PREDNISONE 20 MG PO TABS: 40.0000 mg | ORAL_TABLET | Freq: Once | ORAL | 0 refills | Status: AC

## 2018-06-10 MED ORDER — EPINEPHRINE 0.3 MG/0.3ML IJ SOAJ: 0.3000 mg | Freq: Once | INTRAMUSCULAR | 0 refills | Status: DC | PRN

## 2018-06-10 MED ORDER — SODIUM CHLORIDE 0.9 % IV BOLUS
500.0000 mL | Freq: Once | INTRAVENOUS | Status: AC
  Administered 2018-06-10: 500 mL via INTRAVENOUS

## 2018-06-10 MED ORDER — FAMOTIDINE IN NACL 20-0.9 MG/50ML-% IV SOLN
20.0000 mg | Freq: Once | INTRAVENOUS | Status: AC
  Administered 2018-06-10: 20 mg via INTRAVENOUS
  Filled 2018-06-10: qty 50

## 2018-06-10 NOTE — ED Provider Notes (Signed)
[facility] EMERGENCY DEPARTMENT Provider Note   CSN: [id number] Arrival date & time: 06/10/18  2116     History   Chief Complaint Chief Complaint  Patient presents with  . Allergic Reaction    HPI Tiffany Pugh is a 22 y.o. female who presents today for evaluation of possible allergic reaction.  She is unsure what she is reacting to.  She reports that she had hives and swelling to her lips at around 11 this morning.  She took 50 mg of Benadryl approximately 2 hours ago.  She reports very mild nausea without vomiting.  She denies shortness of breath or chest pain or tightness.  She does report feeling like she is having palpitations and her heart beating very quickly.  She tells me that she is anxious.  She has never had an EpiPen before.  She states that Benadryl normally makes her feel "amped up" instead of tired.    She also is requesting a UA today, stating that her urine was a dark color earlier.  She denies dysuria, increased frequency or urgency.  HPI  Past Medical History:  Diagnosis Date  . BV (bacterial vaginosis) 11/08/2015  . Decreased libido 11/08/2015  . Vaginal discharge 11/08/2015  . Vaginal dryness 11/08/2015    Patient Active Problem List   Diagnosis Date Noted  . Vaginal discharge 11/08/2015  . BV (bacterial vaginosis) 11/08/2015  . Vaginal dryness 11/08/2015  . Decreased libido 11/08/2015    Past Surgical History:  Procedure Laterality Date  . CLEFT PALATE REPAIR    . CLEFT PALATE REPAIR    . lafort ostomy    . RHINOPLASTY  08/28/15  . WISDOM TOOTH EXTRACTION       OB History    Gravida  0   Para  0   Term  0   Preterm  0   AB  0   Living  0     SAB  0   TAB  0   Ectopic  0   Multiple  0   Live Births  0            Home Medications    Prior to Admission medications   Medication Sig Start Date End Date Taking? Authorizing Provider  cyclobenzaprine (FLEXERIL) 5 MG tablet Take 1 tablet (5 mg total) by  mouth 2 (two) times daily with a meal. 01/08/17   Triplett, Tammy, PA-C  ibuprofen (ADVIL,MOTRIN) 600 MG tablet Take 1 tablet (600 mg total) by mouth every 6 (six) hours as needed. Take with food 01/08/17   Triplett, Tammy, PA-C  mirtazapine (REMERON) 15 MG tablet Take 15 mg by mouth at bedtime.    [provider]  norgestimate-ethinyl estradiol (ORTHO-CYCLEN,SPRINTEC,PREVIFEM) 0.25-35 MG-MCG tablet Take 1 tablet by mouth daily. 04/24/16   Estill Dooms, NP    Family History Family History  Problem Relation Age of Onset  . Stroke Maternal Grandfather     Social History Social History   Tobacco Use  . Smoking status: Never Smoker  . Smokeless tobacco: Never Used  Substance Use Topics  . Alcohol use: Yes    Comment: occ  . Drug use: No     Allergies   Bee venom and Hydromorphone   Review of Systems Review of Systems  Constitutional: Negative for chills and fever.  HENT: Negative for congestion.   Respiratory: Negative for chest tightness and shortness of breath.   Cardiovascular: Positive for palpitations.  Gastrointestinal: Positive for nausea. Negative  for abdominal pain, blood in stool, constipation, diarrhea and vomiting.  Musculoskeletal: Negative for back pain and neck pain.  Skin: Positive for color change and pallor.     Physical Exam Updated Vital Signs BP 135/87   Pulse 100   Temp 98.2 F (36.8 C) (Oral)   Resp 16   SpO2 100%   Physical Exam Vitals signs and nursing note reviewed.  Constitutional:      General: She is not in acute distress.    Appearance: She is well-developed. She is not ill-appearing or diaphoretic.  HENT:     Head: Normocephalic and atraumatic.     Mouth/Throat:     Mouth: Mucous membranes are moist.     Pharynx: No posterior oropharyngeal erythema.     Comments: Cleft palate with opening into sinuses. Mild erythema around upper lip. No elevation of the floor of her mouth.  Eyes:     Conjunctiva/sclera: Conjunctivae  normal.  Neck:     Musculoskeletal: Neck supple.  Cardiovascular:     Rate and Rhythm: Regular rhythm. Tachycardia present.     Pulses: Normal pulses.     Heart sounds: Normal heart sounds. No murmur.  Pulmonary:     Effort: Pulmonary effort is normal. No respiratory distress.     Breath sounds: Normal breath sounds. No stridor.  Abdominal:     General: Abdomen is flat. There is no distension.     Palpations: Abdomen is soft.     Tenderness: There is no abdominal tenderness.  Skin:    General: Skin is warm and dry.     Comments: Urticarial rash over bilateral arms, back, chest, and legs.   Neurological:     General: No focal deficit present.     Mental Status: She is alert.      ED Treatments / Results  Labs (all labs ordered are listed, but only abnormal results are displayed) Labs Reviewed  URINALYSIS, [place], URINE    EKG None  Radiology No results found.  Procedures Procedures (including critical care time)  Medications Ordered in ED Medications  methylPREDNISolone sodium succinate (SOLU-MEDROL) 125 mg/2 mL injection 125 mg (has no administration in time range)  famotidine (PEPCID) IVPB 20 mg premix (has no administration in time range)  sodium chloride 0.9 % bolus 500 mL (has no administration in time range)     Initial Impression / Assessment and Plan / ED Course  I have reviewed the triage vital signs and the nursing notes.  Pertinent labs & imaging results that were available during my care of the patient were reviewed by me and considered in my medical decision making (see chart for details).    Patient presents today for evaluation of allergic reaction.  She had onset of hives and swelling to lips at around 11 this morning.  She has mild nausea however has not vomited denies abdominal pain or diarrhea.  She took 50 mg of Benadryl approximately 2 hours prior to arrival.  She is unsure what she is reacting to.  IV Pepcid,  and Solu-Medrol were ordered.  She is given 500 cc fluid bolus as she is intermittently tachycardic.  She does complain of palpitations, therefore EKG was ordered.  She also request a UA given that her urine is dark-colored which was ordered.  At shift change care was transferred to Dr. Alvino Chapel who will follow pending studies, re-evaulate and determine disposition.      Final Clinical Impressions(s) / ED Diagnoses  Final diagnoses:  Allergic reaction, initial encounter    ED Discharge Orders    None       Ollen Gross 06/10/18 2204    Davonna Belling, MD 06/10/18 [phone]

## 2018-06-10 NOTE — ED Notes (Signed)
Patient verbalizes understanding of discharge instructions. Opportunity for questioning and answers were provided. Armband removed by staff, pt discharged from ED.  

## 2018-06-10 NOTE — ED Triage Notes (Signed)
Pt reports sudden onset of hives and swelling to lips at 1100 today. Pt in NAD. Speaking in complete sentences. Unsure of what the reaction is to.

## 2018-07-03 ENCOUNTER — Emergency Department (HOSPITAL_COMMUNITY)
Admission: EM | Admit: 2018-07-03 | Discharge: 2018-07-03 | Disposition: A | Discharge status: Home / Self Care | Payer: 59 | Attending: Emergency Medicine | Admitting: Emergency Medicine

## 2018-07-03 ENCOUNTER — Encounter (HOSPITAL_COMMUNITY): Payer: Self-pay | Admitting: Emergency Medicine

## 2018-07-03 DIAGNOSIS — Y999 Unspecified external cause status: Secondary | ICD-10-CM | POA: Diagnosis not present

## 2018-07-03 DIAGNOSIS — Z23 Encounter for immunization: Secondary | ICD-10-CM | POA: Diagnosis not present

## 2018-07-03 DIAGNOSIS — Y9271 Barn as the place of occurrence of the external cause: Secondary | ICD-10-CM | POA: Insufficient documentation

## 2018-07-03 DIAGNOSIS — S41112A Laceration without foreign body of left upper arm, initial encounter: Secondary | ICD-10-CM | POA: Insufficient documentation

## 2018-07-03 DIAGNOSIS — Y939 Activity, unspecified: Secondary | ICD-10-CM | POA: Diagnosis not present

## 2018-07-03 DIAGNOSIS — W268XXA Contact with other sharp object(s), not elsewhere classified, initial encounter: Secondary | ICD-10-CM | POA: Insufficient documentation

## 2018-07-03 MED ORDER — DIPHTH-ACELL PERTUSSIS-TETANUS 25-58-10 LF-MCG/0.5 IM SUSP
0.5000 mL | Freq: Once | INTRAMUSCULAR | Status: DC
  Filled 2018-07-03: qty 0.5

## 2018-07-03 MED ORDER — POVIDONE-IODINE 10 % EX SOLN
CUTANEOUS | Status: AC
  Filled 2018-07-03: qty 15

## 2018-07-03 MED ORDER — TETANUS-DIPHTH-ACELL PERTUSSIS 5-2.5-18.5 LF-MCG/0.5 IM SUSP
0.5000 mL | Freq: Once | INTRAMUSCULAR | Status: AC
  Administered 2018-07-03: 0.5 mL via INTRAMUSCULAR

## 2018-07-03 MED ORDER — LIDOCAINE-EPINEPHRINE 2 %-1:200000 IJ SOLN
20.0000 mL | Freq: Once | INTRAMUSCULAR | Status: AC
Start: 2018-07-03 — End: 2018-07-03
  Administered 2018-07-03: 10 mL
  Filled 2018-07-03 (×2): qty 20

## 2018-07-03 NOTE — ED Provider Notes (Signed)
[facility] EMERGENCY DEPARTMENT Provider Note   CSN: [id number] Arrival date & time: 07/03/18  2202     History   Chief Complaint Chief Complaint  Patient presents with  . Laceration    HPI Tiffany Pugh is a 23 y.o. female.  Laceration left lateral proximal humerus area on a sharp metal edge of the barn a brief time ago.  No other injuries.  Severity is mild to moderate.     Past Medical History:  Diagnosis Date  . BV (bacterial vaginosis) 11/08/2015  . Decreased libido 11/08/2015  . Vaginal discharge 11/08/2015  . Vaginal dryness 11/08/2015    Patient Active Problem List   Diagnosis Date Noted  . Vaginal discharge 11/08/2015  . BV (bacterial vaginosis) 11/08/2015  . Vaginal dryness 11/08/2015  . Decreased libido 11/08/2015    Past Surgical History:  Procedure Laterality Date  . CLEFT PALATE REPAIR    . CLEFT PALATE REPAIR    . lafort ostomy    . RHINOPLASTY  08/28/15  . WISDOM TOOTH EXTRACTION       OB History    Gravida  0   Para  0   Term  0   Preterm  0   AB  0   Living  0     SAB  0   TAB  0   Ectopic  0   Multiple  0   Live Births  0            Home Medications    Prior to Admission medications   Medication Sig Start Date End Date Taking? Authorizing Provider  cyclobenzaprine (FLEXERIL) 5 MG tablet Take 1 tablet (5 mg total) by mouth 2 (two) times daily with a meal. Patient not taking: Reported on 06/10/2018 01/08/17   Triplett, Tammy, PA-C  diphenhydrAMINE (BENADRYL) 25 MG tablet Take 50 mg by mouth every 6 (six) hours as needed (allergic reaction).    [provider]  EPINEPHrine 0.3 mg/0.3 mL IJ SOAJ injection Inject 0.3 mLs (0.3 mg total) into the muscle once as needed for up to 1 dose. 06/10/18   Davonna Belling, MD  ibuprofen (ADVIL,MOTRIN) 600 MG tablet Take 1 tablet (600 mg total) by mouth every 6 (six) hours as needed. Take with food Patient not taking: Reported on 06/10/2018 01/08/17   Triplett, Tammy, PA-C   mirtazapine (REMERON) 15 MG tablet Take 15 mg by mouth at bedtime as needed (sleep).     [provider]  norgestimate-ethinyl estradiol (ORTHO-CYCLEN,SPRINTEC,PREVIFEM) 0.25-35 MG-MCG tablet Take 1 tablet by mouth daily. Patient not taking: Reported on 06/10/2018 04/24/16   Estill Dooms, NP    Family History Family History  Problem Relation Age of Onset  . Stroke Maternal Grandfather     Social History Social History   Tobacco Use  . Smoking status: Never Smoker  . Smokeless tobacco: Never Used  Substance Use Topics  . Alcohol use: Yes    Comment: occ  . Drug use: No     Allergies   Bee venom and Hydromorphone   Review of Systems Review of Systems  All other systems reviewed and are negative.    Physical Exam Updated Vital Signs BP 128/90 (BP Location: Right Arm)   Pulse 70   Temp 98.1 F (36.7 C) (Oral)   Resp 18   Ht 5\' 5"  (1.651 m)   Wt 47.6 kg   SpO2 100%   BMI 17.47 kg/m   Physical Exam Vitals signs and nursing note  reviewed.  Constitutional:      Appearance: She is well-developed.  HENT:     Head: Normocephalic and atraumatic.  Eyes:     Conjunctiva/sclera: Conjunctivae normal.  Neck:     Musculoskeletal: Neck supple.  Abdominal:     Palpations: Abdomen is soft.  Musculoskeletal: Normal range of motion.  Skin:    Comments: Left lateral occipital humerus area: 3.0 cm laceration  Neurological:     Mental Status: She is alert and oriented to person, place, and time.  Psychiatric:        Behavior: Behavior normal.      ED Treatments / Results  Labs (all labs ordered are listed, but only abnormal results are displayed) Labs Reviewed - No data to display  EKG None  Radiology No results found.  Procedures .Marland KitchenLaceration Repair Date/Time: 07/03/2018 10:30 PM Performed by: Nat Christen, MD Authorized by: Nat Christen, MD   Consent:    Consent obtained:  Verbal   Consent given by:  Patient   Risks discussed:  Pain    Alternatives discussed:  No treatment Anesthesia (see MAR for exact dosages):    Anesthesia method:  Local infiltration   Local anesthetic:  Lidocaine 2% WITH epi Laceration details:    Location:  Shoulder/arm   Shoulder/arm location:  L upper arm   Wound length (cm): 3.0. Repair type:    Repair type:  Simple Pre-procedure details:    Preparation:  Patient was prepped and draped in usual sterile fashion Exploration:    Hemostasis achieved with:  Epinephrine   Contaminated: no   Treatment:    Area cleansed with:  Saline   Amount of cleaning:  Standard   Irrigation solution:  Sterile saline   Irrigation method:  Syringe   Visualized foreign bodies/material removed: no   Skin repair:    Repair method:  Sutures   Suture size:  4-0   Suture material:  Nylon   Suture technique:  Simple interrupted   Number of sutures: 7. Approximation:    Approximation:  Loose Post-procedure details:    Dressing:  Non-adherent dressing   Patient tolerance of procedure:  Tolerated well, no immediate complications   (including critical care time)  Medications Ordered in ED Medications  povidone-iodine (BETADINE) 10 % external solution (has no administration in time range)  lidocaine-EPINEPHrine (XYLOCAINE W/EPI) 2 %-1:200000 (PF) injection 20 mL (10 mLs Infiltration Given 07/03/18 2229)  Tdap (BOOSTRIX) injection 0.5 mL (0.5 mLs Intramuscular Given 07/03/18 2256)     Initial Impression / Assessment and Plan / ED Course  I have reviewed the triage vital signs and the nursing notes.  Pertinent labs & imaging results that were available during my care of the patient were reviewed by me and considered in my medical decision making (see chart for details).     Status post laceration of left upper extremity with simple repair.  No other injuries.  Final Clinical Impressions(s) / ED Diagnoses   Final diagnoses:  Laceration of left upper extremity, initial encounter    ED Discharge Orders     None       Nat Christen, MD 07/03/18 2312

## 2018-07-03 NOTE — Discharge Instructions (Addendum)
Suture out in 7 days.  Keep original dressing on for 2 days;  then can change after that.

## 2018-07-03 NOTE — ED Triage Notes (Signed)
Pt has laceration on left shoulder over deltoid area that happened one hour ago. Pt was cut on metal in a barn. Pt was putting horse up in the barn and the patient rubbed her arm against metal object. Pt denies numbness around the area at this time.

## 2018-07-08 DIAGNOSIS — Z0000 Encounter for general adult medical examination without abnormal findings: Secondary | ICD-10-CM | POA: Diagnosis not present

## 2018-09-14 ENCOUNTER — Telehealth: Payer: 59 | Admitting: Physician Assistant

## 2018-09-14 DIAGNOSIS — Z20818 Contact with and (suspected) exposure to other bacterial communicable diseases: Secondary | ICD-10-CM | POA: Diagnosis not present

## 2018-09-14 DIAGNOSIS — J029 Acute pharyngitis, unspecified: Secondary | ICD-10-CM | POA: Diagnosis not present

## 2018-09-14 MED ORDER — AMOXICILLIN 875 MG PO TABS: 875.0000 mg | ORAL_TABLET | Freq: Two times a day (BID) | ORAL | 0 refills | Status: DC

## 2018-09-14 NOTE — Progress Notes (Signed)
We are sorry that you are not feeling well.  Here is how we plan to help!  Based on what you have shared with me it is likely that you have strep pharyngitis.  Strep pharyngitis is inflammation and infection in the back of the throat.  This is an infection cause by bacteria and is treated with antibiotics.  I have prescribed Amoxicillin 875 mg one tablet twice daily for 10 days. For throat pain, we recommend over the counter oral pain relief medications such as acetaminophen or aspirin, or anti-inflammatory medications such as ibuprofen or naproxen sodium. Topical treatments such as oral throat lozenges or sprays may be used as needed. Strep infections are not as easily transmitted as other respiratory infections, however we still recommend that you avoid close contact with loved ones, especially the very young and elderly.  Remember to wash your hands thoroughly throughout the day as this is the number one way to prevent the spread of infection and wipe down door knobs and counters with disinfectant.   Home Care:  Only take medications as instructed by your medical team.  Complete the entire course of an antibiotic.  Do not take these medications with alcohol.  A steam or ultrasonic humidifier can help congestion.  You can place a towel over your head and breathe in the steam from hot water coming from a faucet.  Avoid close contacts especially the very young and the elderly.  Cover your mouth when you cough or sneeze.  Always remember to wash your hands.  Get Help Right Away If:  You develop worsening fever or sinus pain.  You develop a severe head ache or visual changes.  Your symptoms persist after you have completed your treatment plan.  Make sure you  Understand these instructions.  Will watch your condition.  Will get help right away if you are not doing well or get worse.  Your e-visit answers were reviewed by a board certified advanced clinical practitioner to complete  your personal care plan.  Depending on the condition, your plan could have included both over the counter or prescription medications.  If there is a problem please reply  once you have received a response from your provider.  Your safety is important to [place].  If you have drug allergies check your prescription carefully.    You can use MyChart to ask questions about today's visit, request a non-urgent call back, or ask for a work or school excuse for 24 hours related to this e-Visit. If it has been greater than 24 hours you will need to follow up with your provider, or enter a new e-Visit to address those concerns.  You will get an e-mail in the next two days asking about your experience.  I hope that your e-visit has been valuable and will speed your recovery. Thank you for using e-visits.

## 2018-09-14 NOTE — Progress Notes (Signed)
I have spent 5 minutes in review of e-visit questionnaire, review and updating patient chart, medical decision making and response to patient.   Develle Sievers Cody Sami Roes, PA-C    

## 2018-09-25 ENCOUNTER — Telehealth: Payer: 59 | Admitting: Physician Assistant

## 2018-09-25 DIAGNOSIS — N760 Acute vaginitis: Secondary | ICD-10-CM

## 2018-09-25 MED ORDER — METRONIDAZOLE 500 MG PO TABS: 500.0000 mg | ORAL_TABLET | Freq: Two times a day (BID) | ORAL | 0 refills | Status: DC

## 2018-09-25 NOTE — Progress Notes (Signed)
Called patient to clarify vaginal symptoms.  Vaginal discharge is white and creamy and has fishy odor.  Denies thick curdy discharge and vaginal itching.  Notes she is not typically prone to yeast infections but is very prone to BV.  Prescription for Flagyl sent.  We are sorry that you are not feeling well. Here is how we plan to help! Based on what you shared with me it looks like you: May have a vaginosis due to bacteria  Vaginosis is an inflammation of the vagina that can result in discharge, itching and pain. The cause is usually a change in the normal balance of vaginal bacteria or an infection. Vaginosis can also result from reduced estrogen levels after menopause.  The most common causes of vaginosis are:   Bacterial vaginosis which results from an overgrowth of one on several organisms that are normally present in your vagina.   Yeast infections which are caused by a naturally occurring fungus called candida.   Vaginal atrophy (atrophic vaginosis) which results from the thinning of the vagina from reduced estrogen levels after menopause.   Trichomoniasis which is caused by a parasite and is commonly transmitted by sexual intercourse.  Factors that increase your risk of developing vaginosis include: Marland Kitchen Medications, such as antibiotics and steroids . Uncontrolled diabetes . Use of hygiene products such as bubble bath, vaginal spray or vaginal deodorant . Douching . Wearing damp or tight-fitting clothing . Using an intrauterine device (IUD) for birth control . Hormonal changes, such as those associated with pregnancy, birth control pills or menopause . Sexual activity . Having a sexually transmitted infection  Your treatment plan is Metronidazole or Flagyl 500mg  twice a day for 7 days.  I have electronically sent this prescription into the pharmacy that you have chosen.  Be sure to take all of the medication as directed. Stop taking any medication if you develop a rash, tongue  swelling or shortness of breath. Mothers who are breast feeding should consider pumping and discarding their breast milk while on these antibiotics. However, there is no consensus that infant exposure at these doses would be harmful.  Remember that medication creams can weaken latex condoms. Marland Kitchen   HOME CARE:  Good hygiene may prevent some types of vaginosis from recurring and may relieve some symptoms:  . Avoid baths, hot tubs and whirlpool spas. Rinse soap from your outer genital area after a shower, and dry the area well to prevent irritation. Don't use scented or harsh soaps, such as those with deodorant or antibacterial action. Marland Kitchen Avoid irritants. These include scented tampons and pads. . Wipe from front to back after using the toilet. Doing so avoids spreading fecal bacteria to your vagina.  Other things that may help prevent vaginosis include:  Marland Kitchen Don't douche. Your vagina doesn't require cleansing other than normal bathing. Repetitive douching disrupts the normal organisms that reside in the vagina and can actually increase your risk of vaginal infection. Douching won't clear up a vaginal infection. . Use a latex condom. Both female and female latex condoms may help you avoid infections spread by sexual contact. . Wear cotton underwear. Also wear pantyhose with a cotton crotch. If you feel comfortable without it, skip wearing underwear to bed. Yeast thrives in [employer] Your symptoms should improve in the next day or two.  GET HELP RIGHT AWAY IF:  . You have pain in your lower abdomen ( pelvic area or over your ovaries) . You develop nausea or vomiting . You develop a  fever . Your discharge changes or worsens . You have persistent pain with intercourse . You develop shortness of breath, a rapid pulse, or you faint.  These symptoms could be signs of problems or infections that need to be evaluated by a medical provider now.  MAKE SURE YOU    Understand these  instructions.  Will watch your condition.  Will get help right away if you are not doing well or get worse.  Your e-visit answers were reviewed by a board certified advanced clinical practitioner to complete your personal care plan. Depending upon the condition, your plan could have included both over the counter or prescription medications. Please review your pharmacy choice to make sure that you have choses a pharmacy that is open for you to pick up any needed prescription, Your safety is important to [place]. If you have drug allergies check your prescription carefully.   You can use MyChart to ask questions about today's visit, request a non-urgent call back, or ask for a work or school excuse for 24 hours related to this e-Visit. If it has been greater than 24 hours you will need to follow up with your provider, or enter a new e-Visit to address those concerns. You will get a MyChart message within the next two days asking about your experience. I hope that your e-visit has been valuable and will speed your recovery.

## 2018-12-20 ENCOUNTER — Telehealth: Payer: 59 | Admitting: Family

## 2018-12-20 DIAGNOSIS — B9689 Other specified bacterial agents as the cause of diseases classified elsewhere: Secondary | ICD-10-CM | POA: Diagnosis not present

## 2018-12-20 DIAGNOSIS — N760 Acute vaginitis: Secondary | ICD-10-CM

## 2018-12-20 MED ORDER — METRONIDAZOLE 500 MG PO TABS: 500.0000 mg | ORAL_TABLET | Freq: Two times a day (BID) | ORAL | 0 refills | Status: DC

## 2018-12-20 NOTE — Progress Notes (Signed)
We are sorry that you are not feeling well. Here is how we plan to help! Based on what you shared with me it looks like you: May have a vaginosis due to bacteria  Vaginosis is an inflammation of the vagina that can result in discharge, itching and pain. The cause is usually a change in the normal balance of vaginal bacteria or an infection. Vaginosis can also result from reduced estrogen levels after menopause.  The most common causes of vaginosis are:   Bacterial vaginosis which results from an overgrowth of one on several organisms that are normally present in your vagina.   Yeast infections which are caused by a naturally occurring fungus called candida.   Vaginal atrophy (atrophic vaginosis) which results from the thinning of the vagina from reduced estrogen levels after menopause.   Trichomoniasis which is caused by a parasite and is commonly transmitted by sexual intercourse.  Factors that increase your risk of developing vaginosis include: Marland Kitchen Medications, such as antibiotics and steroids . Uncontrolled diabetes . Use of hygiene products such as bubble bath, vaginal spray or vaginal deodorant . Douching . Wearing damp or tight-fitting clothing . Using an intrauterine device (IUD) for birth control . Hormonal changes, such as those associated with pregnancy, birth control pills or menopause . Sexual activity . Having a sexually transmitted infection  Your treatment plan is Metronidazole or Flagyl 500mg  twice a day for 7 days.  I have electronically sent this prescription into the pharmacy that you have chosen.   If your symptoms do not improve or worsens, you need to follow up with your PCP.  Approximately 5 minutes was spent documenting and reviewing patient's chart.   Be sure to take all of the medication as directed. Stop taking any medication if you develop a rash, tongue swelling or shortness of breath. Mothers who are breast feeding should consider pumping and discarding  their breast milk while on these antibiotics. However, there is no consensus that infant exposure at these doses would be harmful.  Remember that medication creams can weaken latex condoms. Marland Kitchen   HOME CARE:  Good hygiene may prevent some types of vaginosis from recurring and may relieve some symptoms:  . Avoid baths, hot tubs and whirlpool spas. Rinse soap from your outer genital area after a shower, and dry the area well to prevent irritation. Don't use scented or harsh soaps, such as those with deodorant or antibacterial action. Marland Kitchen Avoid irritants. These include scented tampons and pads. . Wipe from front to back after using the toilet. Doing so avoids spreading fecal bacteria to your vagina.  Other things that may help prevent vaginosis include:  Marland Kitchen Don't douche. Your vagina doesn't require cleansing other than normal bathing. Repetitive douching disrupts the normal organisms that reside in the vagina and can actually increase your risk of vaginal infection. Douching won't clear up a vaginal infection. . Use a latex condom. Both female and female latex condoms may help you avoid infections spread by sexual contact. . Wear cotton underwear. Also wear pantyhose with a cotton crotch. If you feel comfortable without it, skip wearing underwear to bed. Yeast thrives in [employer] Your symptoms should improve in the next day or two.  GET HELP RIGHT AWAY IF:  . You have pain in your lower abdomen ( pelvic area or over your ovaries) . You develop nausea or vomiting . You develop a fever . Your discharge changes or worsens . You have persistent pain with intercourse . You develop  shortness of breath, a rapid pulse, or you faint.  These symptoms could be signs of problems or infections that need to be evaluated by a medical provider now.  MAKE SURE YOU    Understand these instructions.  Will watch your condition.  Will get help right away if you are not doing well or get  worse.  Your e-visit answers were reviewed by a board certified advanced clinical practitioner to complete your personal care plan. Depending upon the condition, your plan could have included both over the counter or prescription medications. Please review your pharmacy choice to make sure that you have choses a pharmacy that is open for you to pick up any needed prescription, Your safety is important to [place]. If you have drug allergies check your prescription carefully.   You can use MyChart to ask questions about today's visit, request a non-urgent call back, or ask for a work or school excuse for 24 hours related to this e-Visit. If it has been greater than 24 hours you will need to follow up with your provider, or enter a new e-Visit to address those concerns. You will get a MyChart message within the next two days asking about your experience. I hope that your e-visit has been valuable and will speed your recovery.

## 2018-12-28 ENCOUNTER — Other Ambulatory Visit: Payer: 59

## 2018-12-31 ENCOUNTER — Telehealth: Payer: 59 | Admitting: Family

## 2018-12-31 DIAGNOSIS — J020 Streptococcal pharyngitis: Secondary | ICD-10-CM | POA: Diagnosis not present

## 2018-12-31 MED ORDER — AMOXICILLIN-POT CLAVULANATE 875-125 MG PO TABS: 1.0000 | ORAL_TABLET | Freq: Two times a day (BID) | ORAL | 0 refills | Status: AC

## 2018-12-31 NOTE — Progress Notes (Signed)
Greater than 5 minutes, yet less than 10 minutes of time have been spent researching, coordinating, and implementing care for this patient today.  Thank you for the details you included in the comment boxes. Those details are very helpful in determining the best course of treatment for you and help [place] to provide the best care.  We are sorry that you are not feeling well.  Here is how we plan to help!  Based on what you have shared with me it is likely that you have strep pharyngitis.  Strep pharyngitis is inflammation and infection in the back of the throat.  This is an infection cause by bacteria and is treated with antibiotics.  I have prescribed Augmentin 875 mg/ 125 mg one tablet twice daily for 7 days. For throat pain, we recommend over the counter oral pain relief medications such as acetaminophen or aspirin, or anti-inflammatory medications such as ibuprofen or naproxen sodium. Topical treatments such as oral throat lozenges or sprays may be used as needed. Strep infections are not as easily transmitted as other respiratory infections, however we still recommend that you avoid close contact with loved ones, especially the very young and elderly.  Remember to wash your hands thoroughly throughout the day as this is the number one way to prevent the spread of infection and wipe down door knobs and counters with disinfectant.   Home Care:  Only take medications as instructed by your medical team.  Complete the entire course of an antibiotic.  Do not take these medications with alcohol.  A steam or ultrasonic humidifier can help congestion.  You can place a towel over your head and breathe in the steam from hot water coming from a faucet.  Avoid close contacts especially the very young and the elderly.  Cover your mouth when you cough or sneeze.  Always remember to wash your hands.  Get Help Right Away If:  You develop worsening fever or sinus pain.  You develop a severe head ache or  visual changes.  Your symptoms persist after you have completed your treatment plan.  Make sure you  Understand these instructions.  Will watch your condition.  Will get help right away if you are not doing well or get worse.  Your e-visit answers were reviewed by a board certified advanced clinical practitioner to complete your personal care plan.  Depending on the condition, your plan could have included both over the counter or prescription medications.  If there is a problem please reply  once you have received a response from your provider.  Your safety is important to [place].  If you have drug allergies check your prescription carefully.    You can use MyChart to ask questions about today's visit, request a non-urgent call back, or ask for a work or school excuse for 24 hours related to this e-Visit. If it has been greater than 24 hours you will need to follow up with your provider, or enter a new e-Visit to address those concerns.  You will get an e-mail in the next two days asking about your experience.  I hope that your e-visit has been valuable and will speed your recovery. Thank you for using e-visits.

## 2019-02-16 ENCOUNTER — Ambulatory Visit (INDEPENDENT_AMBULATORY_CARE_PROVIDER_SITE_OTHER): Payer: 59 | Admitting: Women's Health

## 2019-02-16 ENCOUNTER — Encounter: Payer: Self-pay | Admitting: Women's Health

## 2019-02-16 ENCOUNTER — Other Ambulatory Visit: Payer: Self-pay

## 2019-02-16 VITALS — BP 115/71 | HR 73 | Ht 64.0 in | Wt 112.0 lb

## 2019-02-16 DIAGNOSIS — R6882 Decreased libido: Secondary | ICD-10-CM

## 2019-02-16 DIAGNOSIS — Z3202 Encounter for pregnancy test, result negative: Secondary | ICD-10-CM

## 2019-02-16 DIAGNOSIS — R102 Pelvic and perineal pain: Secondary | ICD-10-CM | POA: Diagnosis not present

## 2019-02-16 LAB — POCT URINE PREGNANCY: Preg Test, Ur: NEGATIVE

## 2019-02-16 NOTE — Progress Notes (Signed)
   GYN VISIT Patient name: Tiffany Pugh MRN [id number]  Date of birth: 27-Jul-1995 Chief Complaint:   Pelvic Pain (x 2 weeks)  History of Present Illness:   Tiffany Pugh is a 23 y.o. G0P0000 Caucasian female being seen today for bilateral lower pelvic pain x 2wks. Has aunt who had cervical cancer, so was concerned. Denies abnormal discharge, itching/odor/irritation.  No change in sex partners, but some question if her boyfriend of 5 years was unfaithful. Decreased libido. Not on any meds other than remeron prn, no h/o dep/anx, interested in meds to help if possible  Patient's last menstrual period was 01/28/2019 (approximate). The current method of family planning is coitus interruptus.  Last pap 11/08/15. Results were:  normal Review of Systems:   Pertinent items are noted in HPI Denies fever/chills, dizziness, headaches, visual disturbances, fatigue, shortness of breath, chest pain, abdominal pain, vomiting, abnormal vaginal discharge/itching/odor/irritation, problems with periods, bowel movements, urination, or intercourse unless otherwise stated above.  Pertinent History Reviewed:  Reviewed past medical,surgical, social, obstetrical and family history.  Reviewed problem list, medications and allergies. Physical Assessment:   Vitals:   02/16/19 1520  BP: 115/71  Pulse: 73  Weight: 112 lb (50.8 kg)  Height: 5\' 4"  (1.626 m)  Body mass index is 19.22 kg/m.       Physical Examination:   General appearance: alert, well appearing, and in no distress  Mental status: alert, oriented to person, place, and time  Skin: warm & dry   Cardiovascular: normal heart rate noted  Respiratory: normal respiratory effort, no distress  Abdomen: soft, non-tender   Pelvic: VULVA: normal appearing vulva with no masses, tenderness or lesions, VAGINA: normal appearing vagina with normal color and discharge, no lesions, CERVIX: normal appearing cervix without discharge or lesions,  UTERUS: uterus is normal size, shape, consistency and nontender, ADNEXA: normal adnexa in size, mild bilateral tenderness, Rt >Lt, no masses  Extremities: no edema   No results found for this or any previous visit (from the past 24 hour(s)).  Assessment & Plan:  1) Pelvic pain> nuswab sent  2) Decreased libido> discussed Addyi and Vyleesi, to go home and research, call insurance to see if covered, will talk more at pap visit  3) Past due for pap> schedule today  Meds: No orders of the defined types were placed in this encounter.   Orders Placed This Encounter  Procedures  . NuSwab Vaginitis Plus (VG+)    Return for 1st available, Pap & physical.  Roma Schanz CNM, [facility] 02/16/2019 4:02 PM

## 2019-02-16 NOTE — Addendum Note (Signed)
Addended by: Linton Rump on: 02/16/2019 04:06 PM   Modules accepted: Orders

## 2019-02-16 NOTE — Patient Instructions (Signed)
Addyi (flibanserin) daily pill, do not take with alcohol (sleepiness, hypotension, dizziness) Vyleesi (bremelanotide) injection 45 mins before sex (n/v/headache/flushing)

## 2019-02-19 LAB — NUSWAB VAGINITIS PLUS (VG+)
Atopobium vaginae: HIGH Score — AB
Candida albicans, NAA: NEGATIVE
Candida glabrata, NAA: NEGATIVE
Chlamydia trachomatis, NAA: NEGATIVE
Megasphaera 1: HIGH Score — AB
Neisseria gonorrhoeae, NAA: NEGATIVE
Trich vag by NAA: NEGATIVE

## 2019-02-22 ENCOUNTER — Other Ambulatory Visit: Payer: Self-pay | Admitting: Women's Health

## 2019-02-22 MED ORDER — METRONIDAZOLE 500 MG PO TABS: 500.0000 mg | ORAL_TABLET | Freq: Two times a day (BID) | ORAL | 0 refills | Status: DC

## 2019-02-25 ENCOUNTER — Other Ambulatory Visit (HOSPITAL_COMMUNITY)
Admission: RE | Admit: 2019-02-25 | Discharge: 2019-02-25 | Disposition: A | Discharge status: Home / Self Care | Payer: 59 | Source: Ambulatory Visit | Attending: Obstetrics & Gynecology | Admitting: Obstetrics & Gynecology

## 2019-02-25 ENCOUNTER — Other Ambulatory Visit: Payer: Self-pay

## 2019-02-25 ENCOUNTER — Encounter: Payer: Self-pay | Admitting: Women's Health

## 2019-02-25 ENCOUNTER — Ambulatory Visit (INDEPENDENT_AMBULATORY_CARE_PROVIDER_SITE_OTHER): Payer: 59 | Admitting: Women's Health

## 2019-02-25 VITALS — BP 104/71 | HR 78 | Ht 64.25 in | Wt 109.5 lb

## 2019-02-25 DIAGNOSIS — Z113 Encounter for screening for infections with a predominantly sexual mode of transmission: Secondary | ICD-10-CM

## 2019-02-25 DIAGNOSIS — Z01419 Encounter for gynecological examination (general) (routine) without abnormal findings: Secondary | ICD-10-CM | POA: Insufficient documentation

## 2019-02-25 DIAGNOSIS — R6882 Decreased libido: Secondary | ICD-10-CM

## 2019-02-25 MED ORDER — ADDYI 100 MG PO TABS
100.0000 mg | ORAL_TABLET | Freq: Every day | ORAL | 6 refills | Status: DC
Start: 2019-02-25 — End: 2019-04-22

## 2019-02-25 NOTE — Progress Notes (Signed)
   WELL-WOMAN EXAMINATION Patient name: Tiffany Pugh MRN [id number]  Date of birth: 1995/12/21 Chief Complaint:   Gynecologic Exam  History of Present Illness:   Tiffany Pugh is a 23 y.o. G0P0000 Caucasian female being seen today for a routine well-woman exam.  Current complaints: wants to proceed w/ Addyi for decreased libido. Is on remeron, was initially rx'd for dep/anx, only takes it prn 2-3x/wk to help sleep. Had decreased libido prior to starting remeron.   PCP: Hasanaj      does desire labs including STD screen Patient's last menstrual period was 01/28/2019 (approximate). The current method of family planning is coitus interruptus.  Last pap 11/08/15. Results were: normal Last mammogram: never. Results were: n/a.  Last colonoscopy: never. Results were: n/a.  Review of Systems:   Pertinent items are noted in HPI Denies any headaches, blurred vision, fatigue, shortness of breath, chest pain, abdominal pain, abnormal vaginal discharge/itching/odor/irritation, problems with periods, bowel movements, urination, or intercourse unless otherwise stated above. Pertinent History Reviewed:  Reviewed past medical,surgical, social and family history.  Reviewed problem list, medications and allergies. Physical Assessment:   Vitals:   02/25/19 1021  BP: 104/71  Pulse: 78  Weight: 109 lb 8 oz (49.7 kg)  Height: 5' 4.25" (1.632 m)  Body mass index is 18.65 kg/m.        Physical Examination by Guido Sander, SNP  General appearance - well appearing, and in no distress  Mental status - alert, oriented to person, place, and time  Psych:  She has a normal mood and affect  Skin - warm and dry, normal color, no suspicious lesions noted  Chest - effort normal, all lung fields clear to auscultation bilaterally  Heart - normal rate and regular rhythm  Neck:  midline trachea, no thyromegaly or nodules  Breasts - breasts appear normal, no suspicious masses, no skin or nipple  changes or  axillary nodes  Abdomen - soft, nontender, nondistended, no masses or organomegaly  Pelvic - VULVA: normal appearing vulva with no masses, tenderness or lesions  VAGINA: normal appearing vagina with normal color and discharge, no lesions  CERVIX: normal appearing cervix without discharge or lesions, no CMT  Thin prep pap is done w/ HR HPV cotesting  UTERUS: uterus is felt to be normal size, shape, consistency and nontender   ADNEXA: No adnexal masses or tenderness noted.  Extremities:  No swelling or varicosities noted  No results found for this or any previous visit (from the past 24 hour(s)).  Assessment & Plan:  1) Well-Woman Exam  2) Decreased libido> rx Addyi 100mg  qhs, do not drink ETOH, f/u 8wks  Labs/procedures today: as below  Mammogram @25yo  or sooner if problems Colonoscopy @25yo  or sooner if problems  Orders Placed This Encounter  Procedures  . CBC  . Comprehensive metabolic panel  . TSH  . HIV Antibody (routine testing w rflx)  . RPR    Meds:  Meds ordered this encounter  Medications  . Flibanserin (ADDYI) 100 MG TABS    Sig: Take 100 mg by mouth at bedtime.    Dispense:  30 tablet    Refill:  6    Order Specific Question:   Supervising Provider    Answer:   Florian Buff [[id number]]    Follow-up: Return in about 8 weeks (around 04/22/2019) for F/U, MyChart Video.  [place], [facility] 02/25/2019 11:28 AM

## 2019-02-26 LAB — RPR: RPR Ser Ql: NONREACTIVE

## 2019-02-26 LAB — CBC
Hematocrit: 41.1 % (ref 34.0–46.6)
Hemoglobin: 13.8 g/dL (ref 11.1–15.9)
MCH: 28.4 pg (ref 26.6–33.0)
MCHC: 33.6 g/dL (ref 31.5–35.7)
MCV: 85 fL (ref 79–97)
Platelets: 238 10*3/uL (ref 150–450)
RBC: 4.86 x10E6/uL (ref 3.77–5.28)
RDW: 12.7 % (ref 11.7–15.4)
WBC: 6.6 10*3/uL (ref 3.4–10.8)

## 2019-02-26 LAB — CYTOLOGY - PAP
Chlamydia: NEGATIVE
Diagnosis: NEGATIVE
Neisseria Gonorrhea: NEGATIVE

## 2019-02-26 LAB — COMPREHENSIVE METABOLIC PANEL
ALT: 9 IU/L (ref 0–32)
AST: 15 IU/L (ref 0–40)
Albumin/Globulin Ratio: 1.8 (ref 1.2–2.2)
Albumin: 4.6 g/dL (ref 3.9–5.0)
Alkaline Phosphatase: 60 IU/L (ref 39–117)
BUN/Creatinine Ratio: 16 (ref 9–23)
BUN: 12 mg/dL (ref 6–20)
Bilirubin Total: 0.3 mg/dL (ref 0.0–1.2)
CO2: 21 mmol/L (ref 20–29)
Calcium: 9.2 mg/dL (ref 8.7–10.2)
Chloride: 105 mmol/L (ref 96–106)
Creatinine, Ser: 0.76 mg/dL (ref 0.57–1.00)
GFR calc Af Amer: 127 mL/min/{1.73_m2} (ref >59)
GFR calc non Af Amer: 110 mL/min/{1.73_m2} (ref >59)
Globulin, Total: 2.6 g/dL (ref 1.5–4.5)
Glucose: 78 mg/dL (ref 65–99)
Potassium: 4 mmol/L (ref 3.5–5.2)
Sodium: 139 mmol/L (ref 134–144)
Total Protein: 7.2 g/dL (ref 6.0–8.5)

## 2019-02-26 LAB — HIV ANTIBODY (ROUTINE TESTING W REFLEX): HIV Screen 4th Generation wRfx: NONREACTIVE

## 2019-02-26 LAB — TSH: TSH: 1.91 u[IU]/mL (ref 0.450–4.500)

## 2019-03-03 ENCOUNTER — Encounter: Payer: Self-pay | Admitting: *Deleted

## 2019-03-14 ENCOUNTER — Telehealth: Payer: 59 | Admitting: Physician Assistant

## 2019-03-14 DIAGNOSIS — J020 Streptococcal pharyngitis: Secondary | ICD-10-CM

## 2019-03-14 MED ORDER — PENICILLIN V POTASSIUM 500 MG PO TABS
500.0000 mg | ORAL_TABLET | Freq: Two times a day (BID) | ORAL | 0 refills | Status: DC
Start: 2019-03-14 — End: 2020-04-24

## 2019-03-14 NOTE — Progress Notes (Signed)
We are sorry that you are not feeling well.  Here is how we plan to help!  Based on what you have shared with me it is likely that you have strep pharyngitis.  Strep pharyngitis is inflammation and infection in the back of the throat.  This is an infection cause by bacteria and is treated with antibiotics.  I have prescribed Penicillin V 500 mg twice a day for 10 days. For throat pain, we recommend over the counter oral pain relief medications such as acetaminophen or aspirin, or anti-inflammatory medications such as ibuprofen or naproxen sodium. Topical treatments such as oral throat lozenges or sprays may be used as needed. Strep infections are not as easily transmitted as other respiratory infections, however we still recommend that you avoid close contact with loved ones, especially the very young and elderly.  Remember to wash your hands thoroughly throughout the day as this is the number one way to prevent the spread of infection and wipe down door knobs and counters with disinfectant.   Home Care:  Only take medications as instructed by your medical team.  Complete the entire course of an antibiotic.  Do not take these medications with alcohol.  A steam or ultrasonic humidifier can help congestion.  You can place a towel over your head and breathe in the steam from hot water coming from a faucet.  Avoid close contacts especially the very young and the elderly.  Cover your mouth when you cough or sneeze.  Always remember to wash your hands.  Get Help Right Away If:  You develop worsening fever or sinus pain.  You develop a severe head ache or visual changes.  Your symptoms persist after you have completed your treatment plan.  Make sure you  Understand these instructions.  Will watch your condition.  Will get help right away if you are not doing well or get worse.  Your e-visit answers were reviewed by a board certified advanced clinical practitioner to complete your  personal care plan.  Depending on the condition, your plan could have included both over the counter or prescription medications.  If there is a problem please reply  once you have received a response from your provider.  Your safety is important to [place].  If you have drug allergies check your prescription carefully.    You can use MyChart to ask questions about today's visit, request a non-urgent call back, or ask for a work or school excuse for 24 hours related to this e-Visit. If it has been greater than 24 hours you will need to follow up with your provider, or enter a new e-Visit to address those concerns.  You will get an e-mail in the next two days asking about your experience.  I hope that your e-visit has been valuable and will speed your recovery. Thank you for using e-visits.  Greater than 5 minutes, yet less than 10 minutes of time have been spent researching, coordinating, and implementing care for this patient today

## 2019-04-22 ENCOUNTER — Telehealth: Payer: Self-pay | Admitting: *Deleted

## 2019-04-22 ENCOUNTER — Telehealth: Payer: 59 | Admitting: Women's Health

## 2019-04-22 MED ORDER — ADDYI 100 MG PO TABS: 100.0000 mg | ORAL_TABLET | Freq: Every day | ORAL | 6 refills | Status: DC

## 2019-04-22 NOTE — Telephone Encounter (Signed)
I called patient about her appt today she wasn't aware of it and also hasn't gotten the Addyi. Her insurance denied it. She wanted to try to pay out of pocket but [facility] told her that she needs to have it sent in again. Advised patient that I will send this message to Maudie Mercury and that if she does get it she needs to call us back and schedule tele visit 8 weeks after starting.

## 2019-07-25 ENCOUNTER — Telehealth: Payer: 59 | Admitting: Physician Assistant

## 2019-07-25 DIAGNOSIS — B373 Candidiasis of vulva and vagina: Secondary | ICD-10-CM

## 2019-07-25 DIAGNOSIS — B3731 Acute candidiasis of vulva and vagina: Secondary | ICD-10-CM

## 2019-07-25 MED ORDER — FLUCONAZOLE 150 MG PO TABS: 150.0000 mg | ORAL_TABLET | Freq: Once | ORAL | 0 refills | Status: AC

## 2019-07-25 NOTE — Progress Notes (Signed)
We are sorry that you are not feeling well. Here is how we plan to help! Based on what you shared with me it looks like you: May have a yeast vaginosis  Vaginosis is an inflammation of the vagina that can result in discharge, itching and pain. The cause is usually a change in the normal balance of vaginal bacteria or an infection. Vaginosis can also result from reduced estrogen levels after menopause.  The most common causes of vaginosis are:   Bacterial vaginosis which results from an overgrowth of one on several organisms that are normally present in your vagina.   Yeast infections which are caused by a naturally occurring fungus called candida.   Vaginal atrophy (atrophic vaginosis) which results from the thinning of the vagina from reduced estrogen levels after menopause.   Trichomoniasis which is caused by a parasite and is commonly transmitted by sexual intercourse.  Factors that increase your risk of developing vaginosis include: . Medications, such as antibiotics and steroids . Uncontrolled diabetes . Use of hygiene products such as bubble bath, vaginal spray or vaginal deodorant . Douching . Wearing damp or tight-fitting clothing . Using an intrauterine device (IUD) for birth control . Hormonal changes, such as those associated with pregnancy, birth control pills or menopause . Sexual activity . Having a sexually transmitted infection  Your treatment plan is A single Diflucan (fluconazole) 150mg tablet once.  I have electronically sent this prescription into the pharmacy that you have chosen.  Be sure to take all of the medication as directed. Stop taking any medication if you develop a rash, tongue swelling or shortness of breath. Mothers who are breast feeding should consider pumping and discarding their breast milk while on these antibiotics. However, there is no consensus that infant exposure at these doses would be harmful.  Remember that medication creams can weaken latex  condoms. .   HOME CARE:  Good hygiene may prevent some types of vaginosis from recurring and may relieve some symptoms:  . Avoid baths, hot tubs and whirlpool spas. Rinse soap from your outer genital area after a shower, and dry the area well to prevent irritation. Don't use scented or harsh soaps, such as those with deodorant or antibacterial action. . Avoid irritants. These include scented tampons and pads. . Wipe from front to back after using the toilet. Doing so avoids spreading fecal bacteria to your vagina.  Other things that may help prevent vaginosis include:  . Don't douche. Your vagina doesn't require cleansing other than normal bathing. Repetitive douching disrupts the normal organisms that reside in the vagina and can actually increase your risk of vaginal infection. Douching won't clear up a vaginal infection. . Use a latex condom. Both female and female latex condoms may help you avoid infections spread by sexual contact. . Wear cotton underwear. Also wear pantyhose with a cotton crotch. If you feel comfortable without it, skip wearing underwear to bed. Yeast thrives in moist environments Your symptoms should improve in the next day or two.  GET HELP RIGHT AWAY IF:  . You have pain in your lower abdomen ( pelvic area or over your ovaries) . You develop nausea or vomiting . You develop a fever . Your discharge changes or worsens . You have persistent pain with intercourse . You develop shortness of breath, a rapid pulse, or you faint.  These symptoms could be signs of problems or infections that need to be evaluated by a medical provider now.  MAKE SURE YOU      Understand these instructions.  Will watch your condition.  Will get help right away if you are not doing well or get worse.  Your e-visit answers were reviewed by a board certified advanced clinical practitioner to complete your personal care plan. Depending upon the condition, your plan could have included  both over the counter or prescription medications. Please review your pharmacy choice to make sure that you have choses a pharmacy that is open for you to pick up any needed prescription, Your safety is important to [place]. If you have drug allergies check your prescription carefully.   You can use MyChart to ask questions about today's visit, request a non-urgent call back, or ask for a work or school excuse for 24 hours related to this e-Visit. If it has been greater than 24 hours you will need to follow up with your provider, or enter a new e-Visit to address those concerns. You will get a MyChart message within the next two days asking about your experience. I hope that your e-visit has been valuable and will speed your recovery.   Particia Nearing PA-C  Approximately 5 minutes was spent documenting and reviewing patient's chart.

## 2019-08-23 ENCOUNTER — Telehealth: Payer: 59 | Admitting: Physician Assistant

## 2019-08-23 DIAGNOSIS — J029 Acute pharyngitis, unspecified: Secondary | ICD-10-CM

## 2019-08-23 MED ORDER — MUPIROCIN 2 % EX OINT: 1.0000 "application " | TOPICAL_OINTMENT | Freq: Two times a day (BID) | CUTANEOUS | 0 refills | Status: AC

## 2019-08-23 MED ORDER — AMOXICILLIN 500 MG PO CAPS: 500.0000 mg | ORAL_CAPSULE | Freq: Two times a day (BID) | ORAL | 0 refills | Status: AC

## 2019-08-23 NOTE — Progress Notes (Signed)
We are sorry that you are not feeling well.  Here is how we plan to help!  Based on what you have shared with me it is likely that you have strep pharyngitis.  Strep pharyngitis is inflammation and infection in the back of the throat.  This is an infection cause by bacteria and is treated with antibiotics.  I have prescribed Amoxicillin 500 mg twice a day for 10 days. For throat pain, we recommend over the counter oral pain relief medications such as acetaminophen or aspirin, or anti-inflammatory medications such as ibuprofen or naproxen sodium. Topical treatments such as oral throat lozenges or sprays may be used as needed. Strep infections are not as easily transmitted as other respiratory infections, however we still recommend that you avoid close contact with loved ones, especially the very young and elderly.  Remember to wash your hands thoroughly throughout the day as this is the number one way to prevent the spread of infection and wipe down door knobs and counters with disinfectant.  I have also prescribed Mupirocin for your impetigo.    Home Care:  Only take medications as instructed by your medical team.  Complete the entire course of an antibiotic.  Do not take these medications with alcohol.  A steam or ultrasonic humidifier can help congestion.  You can place a towel over your head and breathe in the steam from hot water coming from a faucet.  Avoid close contacts especially the very young and the elderly.  Cover your mouth when you cough or sneeze.  Always remember to wash your hands.  Get Help Right Away If:  You develop worsening fever or sinus pain.  You develop a severe head ache or visual changes.  Your symptoms persist after you have completed your treatment plan.  Make sure you  Understand these instructions.  Will watch your condition.  Will get help right away if you are not doing well or get worse.  Your e-visit answers were reviewed by a board certified  advanced clinical practitioner to complete your personal care plan.  Depending on the condition, your plan could have included both over the counter or prescription medications.  If there is a problem please reply  once you have received a response from your provider.  Your safety is important to [place].  If you have drug allergies check your prescription carefully.    You can use MyChart to ask questions about today's visit, request a non-urgent call back, or ask for a work or school excuse for 24 hours related to this e-Visit. If it has been greater than 24 hours you will need to follow up with your provider, or enter a new e-Visit to address those concerns.  You will get an e-mail in the next two days asking about your experience.  I hope that your e-visit has been valuable and will speed your recovery. Thank you for using e-visits.   Approximately 5 minutes was spent documenting and reviewing patient's chart.

## 2019-09-19 ENCOUNTER — Other Ambulatory Visit: Payer: Self-pay

## 2019-09-19 ENCOUNTER — Emergency Department (HOSPITAL_COMMUNITY): Payer: 59

## 2019-09-19 ENCOUNTER — Encounter (HOSPITAL_COMMUNITY): Payer: Self-pay | Admitting: *Deleted

## 2019-09-19 ENCOUNTER — Emergency Department (HOSPITAL_COMMUNITY)
Admission: EM | Admit: 2019-09-19 | Discharge: 2019-09-20 | Disposition: A | Discharge status: Home / Self Care | Payer: 59 | Attending: Emergency Medicine | Admitting: Emergency Medicine

## 2019-09-19 DIAGNOSIS — R000 Tachycardia, unspecified: Secondary | ICD-10-CM | POA: Insufficient documentation

## 2019-09-19 DIAGNOSIS — R0602 Shortness of breath: Secondary | ICD-10-CM | POA: Insufficient documentation

## 2019-09-19 DIAGNOSIS — Z5321 Procedure and treatment not carried out due to patient leaving prior to being seen by health care provider: Secondary | ICD-10-CM | POA: Insufficient documentation

## 2019-09-19 LAB — CBC
HCT: 39.4 % (ref 36.0–46.0)
Hemoglobin: 13.3 g/dL (ref 12.0–15.0)
MCH: 28.9 pg (ref 26.0–34.0)
MCHC: 33.8 g/dL (ref 30.0–36.0)
MCV: 85.5 fL (ref 80.0–100.0)
Platelets: 218 10*3/uL (ref 150–400)
RBC: 4.61 MIL/uL (ref 3.87–5.11)
RDW: 12.5 % (ref 11.5–15.5)
WBC: 7.1 10*3/uL (ref 4.0–10.5)
nRBC: 0 % (ref 0.0–0.2)

## 2019-09-19 LAB — BASIC METABOLIC PANEL
Anion gap: 12 (ref 5–15)
BUN: 12 mg/dL (ref 6–20)
CO2: 19 mmol/L — ABNORMAL LOW (ref 22–32)
Calcium: 8.9 mg/dL (ref 8.9–10.3)
Chloride: 106 mmol/L (ref 98–111)
Creatinine, Ser: 0.63 mg/dL (ref 0.44–1.00)
GFR calc Af Amer: >60 mL/min (ref >60)
GFR calc non Af Amer: >60 mL/min (ref >60)
Glucose, Bld: 92 mg/dL (ref 70–99)
Potassium: 3.6 mmol/L (ref 3.5–5.1)
Sodium: 137 mmol/L (ref 135–145)

## 2019-09-19 LAB — I-STAT BETA HCG BLOOD, ED (MC, WL, AP ONLY): I-stat hCG, quantitative: <5 m[IU]/mL (ref <5)

## 2019-09-19 LAB — TROPONIN I (HIGH SENSITIVITY): Troponin I (High Sensitivity): <2 ng/L (ref <18)

## 2019-09-19 MED ORDER — SODIUM CHLORIDE 0.9% FLUSH: 3.0000 mL | Freq: Once | INTRAVENOUS | Status: DC

## 2019-09-19 NOTE — ED Notes (Signed)
THE PT WANTS HER URNE CHECKED SINCE SHES HERE  DARKER THAN USUAL

## 2019-09-19 NOTE — ED Triage Notes (Signed)
THE PT HAS HAD EPISODES OF TACHYCARDIA FOR THE PAST 3 DAYS  SHE HAS ALSO HAD SOME SOB WITH THE RATE    LMP NOW

## 2019-09-20 LAB — URINALYSIS, ROUTINE W REFLEX MICROSCOPIC
Bilirubin Urine: NEGATIVE
Glucose, UA: NEGATIVE mg/dL
Hgb urine dipstick: NEGATIVE
Ketones, ur: 5 mg/dL — AB
Leukocytes,Ua: NEGATIVE
Nitrite: NEGATIVE
Protein, ur: NEGATIVE mg/dL
Specific Gravity, Urine: 1.018 (ref 1.005–1.030)
pH: 7 (ref 5.0–8.0)

## 2019-09-20 NOTE — ED Notes (Signed)
THE PT DECIDED TO LEAVE SHE WILL FOLLOW UP WITH HER REGULAR DOCTOR

## 2019-09-20 NOTE — ED Notes (Signed)
Patient states she has to work in the morning and spoke with triage RN before leaving. Explained the LWBS and AMA and patient understood and still wanted to leave

## 2019-09-20 NOTE — ED Notes (Signed)
THE PT REPORTS THAT HER  PULSE HAS SLOWED NOW AND SHE FEELS BETTER  UNABLE TO STAY LONGER APPROX 8 PEOPLE IN FRONT OF HER

## 2020-03-16 ENCOUNTER — Telehealth: Payer: 59 | Admitting: Emergency Medicine

## 2020-03-16 DIAGNOSIS — N898 Other specified noninflammatory disorders of vagina: Secondary | ICD-10-CM | POA: Diagnosis not present

## 2020-03-16 MED ORDER — METRONIDAZOLE 500 MG PO TABS: 500.0000 mg | ORAL_TABLET | Freq: Two times a day (BID) | ORAL | 0 refills | Status: DC

## 2020-03-16 MED ORDER — FLUCONAZOLE 150 MG PO TABS: 150.0000 mg | ORAL_TABLET | Freq: Once | ORAL | 0 refills | Status: AC

## 2020-03-16 NOTE — Progress Notes (Signed)
We are sorry that you are not feeling well. Here is how we plan to help! Based on what you shared with me it looks like you: May have a vaginosis due to bacteria and yeast.  Vaginosis is an inflammation of the vagina that can result in discharge, itching and pain. The cause is usually a change in the normal balance of vaginal bacteria or an infection. Vaginosis can also result from reduced estrogen levels after menopause.  The most common causes of vaginosis are:   Bacterial vaginosis which results from an overgrowth of one on several organisms that are normally present in your vagina.   Yeast infections which are caused by a naturally occurring fungus called candida.   Vaginal atrophy (atrophic vaginosis) which results from the thinning of the vagina from reduced estrogen levels after menopause.   Trichomoniasis which is caused by a parasite and is commonly transmitted by sexual intercourse.  Factors that increase your risk of developing vaginosis include: Marland Kitchen Medications, such as antibiotics and steroids . Uncontrolled diabetes . Use of hygiene products such as bubble bath, vaginal spray or vaginal deodorant . Douching . Wearing damp or tight-fitting clothing . Using an intrauterine device (IUD) for birth control . Hormonal changes, such as those associated with pregnancy, birth control pills or menopause . Sexual activity . Having a sexually transmitted infection  Your treatment plan is A single Diflucan (fluconazole) 150mg  tablet once.  I have electronically sent this prescription into the pharmacy that you have chosen.  Be sure to take all of the medication as directed. Stop taking any medication if you develop a rash, tongue swelling or shortness of breath. Mothers who are breast feeding should consider pumping and discarding their breast milk while on these antibiotics. However, there is no consensus that infant exposure at these doses would be harmful.  Remember that medication  creams can weaken latex condoms. Marland Kitchen   HOME CARE:  Good hygiene may prevent some types of vaginosis from recurring and may relieve some symptoms:  . Avoid baths, hot tubs and whirlpool spas. Rinse soap from your outer genital area after a shower, and dry the area well to prevent irritation. Don't use scented or harsh soaps, such as those with deodorant or antibacterial action. Marland Kitchen Avoid irritants. These include scented tampons and pads. . Wipe from front to back after using the toilet. Doing so avoids spreading fecal bacteria to your vagina.  Other things that may help prevent vaginosis include:  Marland Kitchen Don't douche. Your vagina doesn't require cleansing other than normal bathing. Repetitive douching disrupts the normal organisms that reside in the vagina and can actually increase your risk of vaginal infection. Douching won't clear up a vaginal infection. . Use a latex condom. Both female and female latex condoms may help you avoid infections spread by sexual contact. . Wear cotton underwear. Also wear pantyhose with a cotton crotch. If you feel comfortable without it, skip wearing underwear to bed. Yeast thrives in [employer] Your symptoms should improve in the next day or two.  GET HELP RIGHT AWAY IF:  . You have pain in your lower abdomen ( pelvic area or over your ovaries) . You develop nausea or vomiting . You develop a fever . Your discharge changes or worsens . You have persistent pain with intercourse . You develop shortness of breath, a rapid pulse, or you faint.  These symptoms could be signs of problems or infections that need to be evaluated by a medical provider now.  MAKE  SURE YOU    Understand these instructions.  Will watch your condition.  Will get help right away if you are not doing well or get worse.  Your e-visit answers were reviewed by a board certified advanced clinical practitioner to complete your personal care plan. Depending upon the condition, your  plan could have included both over the counter or prescription medications. Please review your pharmacy choice to make sure that you have choses a pharmacy that is open for you to pick up any needed prescription, Your safety is important to [place]. If you have drug allergies check your prescription carefully.   You can use MyChart to ask questions about today's visit, request a non-urgent call back, or ask for a work or school excuse for 24 hours related to this e-Visit. If it has been greater than 24 hours you will need to follow up with your provider, or enter a new e-Visit to address those concerns. You will get a MyChart message within the next two days asking about your experience. I hope that your e-visit has been valuable and will speed your recovery.  Approximately 5 minutes was used in reviewing the patient's chart, questionnaire, prescribing medications, and documentation.

## 2020-04-14 ENCOUNTER — Other Ambulatory Visit: Payer: Self-pay

## 2020-04-14 ENCOUNTER — Emergency Department (HOSPITAL_COMMUNITY): Payer: 59

## 2020-04-14 ENCOUNTER — Emergency Department (HOSPITAL_COMMUNITY)
Admission: EM | Admit: 2020-04-14 | Discharge: 2020-04-14 | Disposition: A | Discharge status: Home / Self Care | Payer: 59 | Attending: Emergency Medicine | Admitting: Emergency Medicine

## 2020-04-14 DIAGNOSIS — K5900 Constipation, unspecified: Secondary | ICD-10-CM | POA: Insufficient documentation

## 2020-04-14 DIAGNOSIS — B9689 Other specified bacterial agents as the cause of diseases classified elsewhere: Secondary | ICD-10-CM | POA: Diagnosis not present

## 2020-04-14 DIAGNOSIS — R109 Unspecified abdominal pain: Secondary | ICD-10-CM | POA: Diagnosis present

## 2020-04-14 DIAGNOSIS — R102 Pelvic and perineal pain: Secondary | ICD-10-CM

## 2020-04-14 DIAGNOSIS — N760 Acute vaginitis: Secondary | ICD-10-CM | POA: Insufficient documentation

## 2020-04-14 LAB — CBC WITH DIFFERENTIAL/PLATELET
Abs Immature Granulocytes: 0.01 10*3/uL (ref 0.00–0.07)
Basophils Absolute: 0 10*3/uL (ref 0.0–0.1)
Basophils Relative: 1 %
Eosinophils Absolute: 0.1 10*3/uL (ref 0.0–0.5)
Eosinophils Relative: 1 %
HCT: 38.1 % (ref 36.0–46.0)
Hemoglobin: 12.8 g/dL (ref 12.0–15.0)
Immature Granulocytes: 0 %
Lymphocytes Relative: 31 %
Lymphs Abs: 1.8 10*3/uL (ref 0.7–4.0)
MCH: 28.3 pg (ref 26.0–34.0)
MCHC: 33.6 g/dL (ref 30.0–36.0)
MCV: 84.1 fL (ref 80.0–100.0)
Monocytes Absolute: 0.4 10*3/uL (ref 0.1–1.0)
Monocytes Relative: 7 %
Neutro Abs: 3.6 10*3/uL (ref 1.7–7.7)
Neutrophils Relative %: 60 %
Platelets: 209 10*3/uL (ref 150–400)
RBC: 4.53 MIL/uL (ref 3.87–5.11)
RDW: 12.2 % (ref 11.5–15.5)
WBC: 5.9 10*3/uL (ref 4.0–10.5)
nRBC: 0 % (ref 0.0–0.2)

## 2020-04-14 LAB — URINALYSIS, ROUTINE W REFLEX MICROSCOPIC
Bilirubin Urine: NEGATIVE
Glucose, UA: NEGATIVE mg/dL
Hgb urine dipstick: NEGATIVE
Ketones, ur: NEGATIVE mg/dL
Leukocytes,Ua: NEGATIVE
Nitrite: NEGATIVE
Protein, ur: NEGATIVE mg/dL
Specific Gravity, Urine: 1.006 (ref 1.005–1.030)
pH: 7 (ref 5.0–8.0)

## 2020-04-14 LAB — RAPID HIV SCREEN (HIV 1/2 AB+AG)
HIV 1/2 Antibodies: NONREACTIVE
HIV-1 P24 Antigen - HIV24: NONREACTIVE

## 2020-04-14 LAB — COMPREHENSIVE METABOLIC PANEL
ALT: 14 U/L (ref 0–44)
AST: 16 U/L (ref 15–41)
Albumin: 4.5 g/dL (ref 3.5–5.0)
Alkaline Phosphatase: 40 U/L (ref 38–126)
Anion gap: 9 (ref 5–15)
BUN: 8 mg/dL (ref 6–20)
CO2: 21 mmol/L — ABNORMAL LOW (ref 22–32)
Calcium: 9.3 mg/dL (ref 8.9–10.3)
Chloride: 106 mmol/L (ref 98–111)
Creatinine, Ser: 0.81 mg/dL (ref 0.44–1.00)
GFR, Estimated: >60 mL/min (ref >60)
Glucose, Bld: 98 mg/dL (ref 70–99)
Potassium: 3.9 mmol/L (ref 3.5–5.1)
Sodium: 136 mmol/L (ref 135–145)
Total Bilirubin: 0.9 mg/dL (ref 0.3–1.2)
Total Protein: 7.1 g/dL (ref 6.5–8.1)

## 2020-04-14 LAB — HEPATITIS PANEL, ACUTE
HCV Ab: NONREACTIVE
Hep A IgM: NONREACTIVE
Hep B C IgM: NONREACTIVE
Hepatitis B Surface Ag: NONREACTIVE

## 2020-04-14 LAB — LIPASE, BLOOD: Lipase: 27 U/L (ref 11–51)

## 2020-04-14 LAB — WET PREP, GENITAL
Sperm: NONE SEEN
Trich, Wet Prep: NONE SEEN
Yeast Wet Prep HPF POC: NONE SEEN

## 2020-04-14 LAB — HCG, QUANTITATIVE, PREGNANCY: hCG, Beta Chain, Quant, S: 1 m[IU]/mL (ref <5)

## 2020-04-14 MED ORDER — METRONIDAZOLE 500 MG PO TABS: 500.0000 mg | ORAL_TABLET | Freq: Two times a day (BID) | ORAL | 0 refills | Status: DC

## 2020-04-14 MED ORDER — IOHEXOL 300 MG/ML  SOLN
100.0000 mL | Freq: Once | INTRAMUSCULAR | Status: AC | PRN
  Administered 2020-04-14: 100 mL via INTRAVENOUS

## 2020-04-14 MED ORDER — HYDROXYZINE HCL 25 MG PO TABS
25.0000 mg | ORAL_TABLET | Freq: Once | ORAL | Status: AC
  Administered 2020-04-14: 25 mg via ORAL
  Filled 2020-04-14: qty 1

## 2020-04-14 MED ORDER — OXYCODONE-ACETAMINOPHEN 5-325 MG PO TABS
1.0000 | ORAL_TABLET | ORAL | Status: DC | PRN
  Administered 2020-04-14: 1 via ORAL
  Filled 2020-04-14: qty 1

## 2020-04-14 MED ORDER — LACTATED RINGERS IV BOLUS
1000.0000 mL | Freq: Once | INTRAVENOUS | Status: AC
  Administered 2020-04-14: 1000 mL via INTRAVENOUS

## 2020-04-14 NOTE — ED Triage Notes (Signed)
Pt has been having right lower back pain and pelvic pain x 1 week. Pt said she was urinating more frequently and felt as if she was not emptying her bladder. She was placed Bactrim with no relief then on Cipro and pt said still hurting in her lower right back and lower pelvis area. Pt has no blood in urine.

## 2020-04-14 NOTE — ED Notes (Signed)
Patient transported to [facility] 

## 2020-04-14 NOTE — ED Provider Notes (Signed)
I have personally seen and examined the patient. I have reviewed the documentation on PMH/FH/Soc Hx. I have discussed the plan of care with the resident and patient.  I have reviewed and agree with the resident's documentation. Please see associated encounter note.  Briefly, the patient is a 24 y.o. female here with lower abdominal pain.  Just finished a course of antibiotics without much improvement.  Pain for the last 2 weeks but worse over the last 2 days.  She is tender throughout lower abdomen but may be worse in the right lower quadrant.  She feels like it is a deep pelvic pain.  Denies any vaginal bleeding or vaginal discharge.  Urinalysis is negative for infection.  Suspect that this could be pelvic related.  Does not appear overly consistent with torsion but will get ultrasound to evaluate for cyst versus torsion.  If the ultrasound is completely normal and pelvic exam is unremarkable may consider CT scan to rule out appendicitis or other intra-abdominal process.  This could also be constipation related as well.  She is not have fever.  Please see my resident's note for further results, evaluation and disposition of the patient.  This chart was dictated using voice recognition software.  Despite best efforts to proofread,  errors can occur which can change the documentation meaning.     EKG Interpretation None         Lennice Sites, DO 04/14/20 1038

## 2020-04-14 NOTE — ED Provider Notes (Signed)
[facility] EMERGENCY DEPARTMENT Provider Note   CSN: [id number] Arrival date & time: 04/14/20  0134     History Chief Complaint  Patient presents with  . Flank Pain    Tiffany Pugh is a 24 y.o. female.  Patient reports about a week ago, she had sudden onset urinary urgency, frequency, dysuria, feelings of incomplete bladder emptying.  Thought she had a UTI, so provider she works with prescribed her Macrobid.  After taking the Ailey for 3 days with no improvement in her symptoms, she went to the doctor.  She reports her labs are slightly abnormal, including elevated creatinine, so they diagnosed her with pyelonephritis and put her on ciprofloxacin.  She has been on ciprofloxacin about 3 to 4 days, so she has been on antibiotics since symptom onset about a week ago.  She reports her urinary symptoms have resolved, but she just has feelings of nausea and pretty significant pelvic pain and pressure.  She denies vaginal symptoms.  Her last period started about 2 weeks ago and resolved the day prior to symptom onset, and while this was an expected period, it seemed heavier than normal with a lot of clots.  The history is provided by the patient.  Female GU Problem This is a new problem. Episode onset: 1 week ago. The problem occurs constantly. The problem has been gradually worsening. Pertinent negatives include no chest pain, no abdominal pain and no shortness of breath. Nothing aggravates the symptoms. Nothing relieves the symptoms. Treatments tried: macrobid and then ciprofloxacin. The treatment provided no relief.       Past Medical History:  Diagnosis Date  . BV (bacterial vaginosis) 11/08/2015  . Decreased libido 11/08/2015  . Vaginal discharge 11/08/2015  . Vaginal dryness 11/08/2015    Patient Active Problem List   Diagnosis Date Noted  . BV (bacterial vaginosis) 11/08/2015  . Decreased libido 11/08/2015    Past Surgical History:  Procedure  Laterality Date  . CLEFT PALATE REPAIR    . CLEFT PALATE REPAIR    . lafort ostomy    . RHINOPLASTY  08/28/15  . WISDOM TOOTH EXTRACTION       OB History    Gravida  0   Para  0   Term  0   Preterm  0   AB  0   Living  0     SAB  0   TAB  0   Ectopic  0   Multiple  0   Live Births  0           Family History  Problem Relation Age of Onset  . Stroke Maternal Grandfather   . Cancer Maternal Aunt        ovarian    Social History   Tobacco Use  . Smoking status: Never Smoker  . Smokeless tobacco: Never Used  Vaping Use  . Vaping Use: Never used  Substance Use Topics  . Alcohol use: Yes    Comment: occ  . Drug use: No    Home Medications Prior to Admission medications   Medication Sig Start Date End Date Taking? Authorizing Provider  ciprofloxacin (CIPRO) 500 MG tablet Take 500 mg by mouth 2 (two) times daily. 04/11/20  Yes [provider]  EPINEPHrine 0.3 mg/0.3 mL IJ SOAJ injection Inject 0.3 mLs (0.3 mg total) into the muscle once as needed for up to 1 dose. Patient not taking: Reported on 04/14/2020 06/10/18   Davonna Belling, MD  Flibanserin (  ADDYI) 100 MG TABS Take 100 mg by mouth at bedtime. Patient not taking: Reported on 04/14/2020 04/22/19   Roma Schanz, CNM  metroNIDAZOLE (FLAGYL) 500 MG tablet Take 1 tablet (500 mg total) by mouth 2 (two) times daily. 04/14/20   Launa Flight, MD  penicillin v potassium (VEETID) 500 MG tablet Take 1 tablet (500 mg total) by mouth 2 (two) times daily. Patient not taking: Reported on 04/14/2020 03/14/19   Okey Regal, PA-C    Allergies    Bee venom and Hydromorphone  Review of Systems   Review of Systems  Constitutional: Negative for chills and fever.  HENT: Negative for ear pain and sore throat.   Eyes: Negative for pain and visual disturbance.  Respiratory: Negative for cough and shortness of breath.   Cardiovascular: Negative for chest pain and palpitations.    Gastrointestinal: Positive for nausea. Negative for abdominal pain and vomiting.  Genitourinary: Positive for dysuria, frequency, pelvic pain and urgency. Negative for hematuria.  Musculoskeletal: Negative for arthralgias and back pain.  Skin: Negative for color change and rash.  Neurological: Negative for seizures and syncope.  All other systems reviewed and are negative.   Physical Exam Updated Vital Signs BP (!) 99/59 (BP Location: Left Arm)   Pulse (!) 55   Temp 98 F (36.7 C) (Oral)   Resp 17   Ht 5\' 4"  (1.626 m)   Wt 48.5 kg   LMP 04/06/2020   SpO2 97%   BMI 18.37 kg/m   Physical Exam Vitals and nursing note reviewed. Exam conducted with a chaperone present.  Constitutional:      Appearance: She is well-developed. She is not ill-appearing, toxic-appearing or diaphoretic.  HENT:     Head: Normocephalic and atraumatic.     Mouth/Throat:     Mouth: Mucous membranes are dry.     Pharynx: Oropharynx is clear.  Eyes:     Conjunctiva/sclera: Conjunctivae normal.  Cardiovascular:     Rate and Rhythm: Normal rate and regular rhythm.     Heart sounds: No murmur heard.  No gallop.   Pulmonary:     Effort: Pulmonary effort is normal. No respiratory distress.     Breath sounds: Normal breath sounds.  Abdominal:     Palpations: Abdomen is soft.     Tenderness: There is abdominal tenderness in the right lower quadrant, suprapubic area and left lower quadrant. There is no right CVA tenderness, left CVA tenderness, guarding or rebound.  Genitourinary:    Vagina: Vaginal discharge (copious thin white) present. No erythema or bleeding.     Cervix: No cervical motion tenderness, discharge, friability, erythema or cervical bleeding.     Uterus: Normal.      Adnexa:        Right: No mass, tenderness or fullness.         Left: No mass, tenderness or fullness.    Musculoskeletal:     Cervical back: Neck supple.  Skin:    General: Skin is warm and dry.  Neurological:     Mental  Status: She is alert.     ED Results / Procedures / Treatments   Labs (all labs ordered are listed, but only abnormal results are displayed) Labs Reviewed  WET PREP, GENITAL - Abnormal; Notable for the following components:      Result Value   Clue Cells Wet Prep HPF POC PRESENT (*)    WBC, Wet Prep HPF POC FEW (*)    All other components within normal limits  URINALYSIS, ROUTINE W REFLEX MICROSCOPIC - Abnormal; Notable for the following components:   Color, Urine STRAW (*)    All other components within normal limits  COMPREHENSIVE METABOLIC PANEL - Abnormal; Notable for the following components:   CO2 21 (*)    All other components within normal limits  CBC WITH DIFFERENTIAL/PLATELET  LIPASE, BLOOD  HCG, QUANTITATIVE, PREGNANCY  RAPID HIV SCREEN (HIV 1/2 AB+AG)  HEPATITIS PANEL, ACUTE  I-STAT BETA HCG BLOOD, ED ([facility], [facility], [facility] ONLY)  GC/CHLAMYDIA PROBE AMP ([place]) NOT AT [facility]    EKG None  Radiology CT ABDOMEN PELVIS W CONTRAST  Result Date: 04/14/2020 CLINICAL DATA:  Right lower quadrant abdominal pain. Suspected appendicitis. EXAM: CT ABDOMEN AND PELVIS WITH CONTRAST TECHNIQUE: Multidetector CT imaging of the abdomen and pelvis was performed using the standard protocol following bolus administration of intravenous contrast. CONTRAST:  174mL OMNIPAQUE IOHEXOL 300 MG/ML  SOLN COMPARISON:  None. FINDINGS: Lower chest: Normal Hepatobiliary: Normal Pancreas: Normal Spleen: Normal Adrenals/Urinary Tract: Adrenal glands are normal. Kidneys are normal. Bladder is normal. Stomach/Bowel: Stomach and small intestine are normal. Fairly large amount of fecal matter throughout the colon. I cannot identify the appendix with certainty, but there is no evidence of bowel or soft tissue inflammation in the right lower quadrant or Peri cecal region. Vascular/Lymphatic: Normal Reproductive: Uterus appears normal. 1.9 cm cyst associated with the right ovary, likely a functional cyst in a female of  this age. Other: No free fluid or air. Musculoskeletal: No significant finding. IMPRESSION: 1. I cannot identify the appendix with certainty, but there is no evidence of bowel or soft tissue inflammation in the right lower quadrant or pericecal region. 2. Fairly large amount of fecal matter throughout the colon. 3. 1.9 cm cyst associated with the right ovary, likely a functional cyst in a female of this age. Electronically Signed   By: Nelson Chimes M.D.   On: 04/14/2020 14:17   US PELVIC COMPLETE W TRANSVAGINAL AND TORSION R/O  Result Date: 04/14/2020 CLINICAL DATA:  Pelvic pain, LMP 04/07/2020 EXAM: TRANSABDOMINAL AND TRANSVAGINAL ULTRASOUND OF PELVIS DOPPLER ULTRASOUND OF OVARIES TECHNIQUE: Both transabdominal and transvaginal ultrasound examinations of the pelvis were performed. Transabdominal technique was performed for global imaging of the pelvis including uterus, ovaries, adnexal regions, and pelvic cul-de-sac. It was necessary to proceed with endovaginal exam following the transabdominal exam to visualize the ovaries bilaterally. Color and duplex Doppler ultrasound was utilized to evaluate blood flow to the ovaries. COMPARISON:  None. FINDINGS: Uterus Measurements: 6.1 x 3.6 x 4.4 cm = volume: 50 mL. The uterus is anteverted. No intrauterine masses are identified. The junctional zone is unremarkable. Several nabothian cysts are seen within the cervix which is otherwise unremarkable. Endometrium Thickness: 11 mm.  No focal abnormality visualized. Right ovary Measurements: 3.4 x 1.9 x 3.1 cm = volume: 11 mL. Normal appearance/no adnexal mass. Dominant follicle noted within the right ovary. Left ovary Measurements: 2.7 x 1.2 x 2.3 cm = volume: 4 mL. Normal appearance/no adnexal mass. Pulsed Doppler evaluation of both ovaries demonstrates normal low-resistance arterial and venous waveforms. Other findings Small simple appearing free fluid is seen surrounding the right ovary and within the cul-de-sac,  possibly physiologic in a patient of this age. IMPRESSION: Normal pelvic sonogram.  Normal ovarian vascularity. Electronically Signed   By: Fidela Salisbury MD   On: 04/14/2020 10:41    Procedures Procedures (including critical care time)  Medications Ordered in ED Medications  oxyCODONE-acetaminophen (PERCOCET/ROXICET) 5-325 MG per tablet 1 tablet (  1 tablet Oral Given 04/14/20 0818)  hydrOXYzine (ATARAX/VISTARIL) tablet 25 mg (25 mg Oral Given 04/14/20 1143)  lactated ringers bolus 1,000 mL (0 mLs Intravenous Stopped 04/14/20 1221)  iohexol (OMNIPAQUE) 300 MG/ML solution 100 mL (100 mLs Intravenous Contrast Given 04/14/20 1357)    ED Course  I have reviewed the triage vital signs and the nursing notes.  Pertinent labs & imaging results that were available during my care of the patient were reviewed by me and considered in my medical decision making (see chart for details).    MDM Rules/Calculators/A&P                          The patient is a 24yo female, PMH otherwise healthy, who presents to the ED for pelvic pain.  On my initial evaluation, the patient is  hemodynamically stable, afebrile, nontoxic-appearing. Physical exam remarkable for diffuse lower abdominal and pelvic tenderness, pelvic exam remarkable for copious thin white discharge.  Differentials considered include UTI, pyelonephritis, kidney stones, appendicitis, ovarian torsion, PID. Patient had been provided percocet while in the waiting room, is declining additional pain meds at this time.  Patient is requesting something for anxiety.  Atarax provided.  Labs remarkable for urine appearing negative for infection, wet prep with clue cells concerning for BV.  Ultrasound remarkable for some physiologic free fluid in the pelvis, so I am much less concerned for torsion, TOA.  Patient symptoms are likely related to BV.  However, patient reports that she gets BV somewhat frequently, and this pain feels much worse than normal and is  more right-sided than her usual BV discomfort.  With otherwise reassuring pelvic imaging and exam, low suspicion for pelvic etiology of pain besides BV.  Due to patient's insistence that this pain is different and more severe, will obtain CT abdomen pelvis to evaluate for appendicitis or other intra-abdominal causes of her pain today.  Patient continues to decline pain medication.  CT abdomen pelvis remarkable for significant stool burden.  Radiology cannot identify the appendix, but there is no evidence of inflammatory changes in the right lower quadrant.  With no leukocytosis on labs, no fevers, very low suspicion for occult appendicitis.  Do not think patient requires further imaging or evaluation.  Patient then also requested HIV and hepatitis testing, which was done and negative.  Advised patient of likely diagnosis of constipation and bacterial vaginosis.  Prescribed Flagyl for bacterial vaginosis.  Advised treatment of constipation with MiraLAX and increased water intake.  Recommended follow-up with PCP in the next couple days.  Strict return precautions provided.  Patient amenable to plan for discharge and voiced understanding of discharge instructions.  Patient discharged in stable condition.  The care of this patient was overseen by Dr. Ronnald Nian, who agreed with evaluation and plan of care.   Final Clinical Impression(s) / ED Diagnoses Final diagnoses:  Bacterial vaginosis  Constipation, unspecified constipation type    Rx / DC Orders ED Discharge Orders         Ordered    metroNIDAZOLE (FLAGYL) 500 MG tablet  2 times daily        04/14/20 1425           Launa Flight, MD 04/14/20 [place], [place], DO 04/14/20 1454

## 2020-04-17 LAB — GC/CHLAMYDIA PROBE AMP (~~LOC~~) NOT AT ARMC
Chlamydia: NEGATIVE
Comment: NEGATIVE
Comment: NORMAL
Neisseria Gonorrhea: NEGATIVE

## 2020-04-24 ENCOUNTER — Other Ambulatory Visit: Payer: Self-pay

## 2020-04-24 ENCOUNTER — Emergency Department (HOSPITAL_COMMUNITY)
Admission: EM | Admit: 2020-04-24 | Discharge: 2020-04-24 | Disposition: A | Discharge status: Home / Self Care | Payer: 59 | Attending: Emergency Medicine | Admitting: Emergency Medicine

## 2020-04-24 ENCOUNTER — Emergency Department (HOSPITAL_COMMUNITY): Payer: 59

## 2020-04-24 DIAGNOSIS — W5512XA Struck by horse, initial encounter: Secondary | ICD-10-CM | POA: Insufficient documentation

## 2020-04-24 DIAGNOSIS — S92531B Displaced fracture of distal phalanx of right lesser toe(s), initial encounter for open fracture: Secondary | ICD-10-CM

## 2020-04-24 DIAGNOSIS — T1490XA Injury, unspecified, initial encounter: Secondary | ICD-10-CM

## 2020-04-24 DIAGNOSIS — S99921A Unspecified injury of right foot, initial encounter: Secondary | ICD-10-CM | POA: Diagnosis present

## 2020-04-24 DIAGNOSIS — S92531A Displaced fracture of distal phalanx of right lesser toe(s), initial encounter for closed fracture: Secondary | ICD-10-CM | POA: Insufficient documentation

## 2020-04-24 MED ORDER — FLUCONAZOLE 150 MG PO TABS: ORAL_TABLET | ORAL | 1 refills | Status: DC

## 2020-04-24 MED ORDER — CEPHALEXIN 500 MG PO CAPS: 500.0000 mg | ORAL_CAPSULE | Freq: Four times a day (QID) | ORAL | 0 refills | Status: AC

## 2020-04-24 NOTE — Discharge Instructions (Addendum)
Follow up with Dr. Amedeo Kinsman

## 2020-04-24 NOTE — ED Provider Notes (Signed)
[facility] EMERGENCY DEPARTMENT Provider Note   CSN: [id number] Arrival date & time: 04/24/20  1548     History Chief Complaint  Patient presents with  . Toe Pain    Tiffany Pugh is a 24 y.o. female.  The history is provided by the patient. No language interpreter was used.  Toe Pain This is a new problem. The current episode started 1 to 2 hours ago. The problem occurs constantly. The problem has not changed since onset.Nothing aggravates the symptoms. She has tried nothing for the symptoms.  Pt reports her horse spooked and stepped on her toe.  Pt did not have shoes on.  Horse did have shoes on.  Pt's tetanus shot is up to date  Pt has pain in right 3rd toe     Past Medical History:  Diagnosis Date  . BV (bacterial vaginosis) 11/08/2015  . Decreased libido 11/08/2015  . Vaginal discharge 11/08/2015  . Vaginal dryness 11/08/2015    Patient Active Problem List   Diagnosis Date Noted  . BV (bacterial vaginosis) 11/08/2015  . Decreased libido 11/08/2015    Past Surgical History:  Procedure Laterality Date  . CLEFT PALATE REPAIR    . CLEFT PALATE REPAIR    . lafort ostomy    . RHINOPLASTY  08/28/15  . WISDOM TOOTH EXTRACTION       OB History    Gravida  0   Para  0   Term  0   Preterm  0   AB  0   Living  0     SAB  0   TAB  0   Ectopic  0   Multiple  0   Live Births  0           Family History  Problem Relation Age of Onset  . Stroke Maternal Grandfather   . Cancer Maternal Aunt        ovarian    Social History   Tobacco Use  . Smoking status: Never Smoker  . Smokeless tobacco: Never Used  Vaping Use  . Vaping Use: Never used  Substance Use Topics  . Alcohol use: Yes    Comment: occ  . Drug use: No    Home Medications Prior to Admission medications   Medication Sig Start Date End Date Taking? Authorizing Provider  ciprofloxacin (CIPRO) 500 MG tablet Take 500 mg by mouth 2 (two) times daily. 04/11/20   [provider]  EPINEPHrine 0.3 mg/0.3 mL IJ SOAJ injection Inject 0.3 mLs (0.3 mg total) into the muscle once as needed for up to 1 dose. Patient not taking: Reported on 04/14/2020 06/10/18   Davonna Belling, MD  Flibanserin (ADDYI) 100 MG TABS Take 100 mg by mouth at bedtime. Patient not taking: Reported on 04/14/2020 04/22/19   Roma Schanz, CNM  metroNIDAZOLE (FLAGYL) 500 MG tablet Take 1 tablet (500 mg total) by mouth 2 (two) times daily. 04/14/20   Launa Flight, MD  penicillin v potassium (VEETID) 500 MG tablet Take 1 tablet (500 mg total) by mouth 2 (two) times daily. Patient not taking: Reported on 04/14/2020 03/14/19   Okey Regal, PA-C    Allergies    Bee venom and Hydromorphone  Review of Systems   Review of Systems  All other systems reviewed and are negative.   Physical Exam Updated Vital Signs BP 114/77 (BP Location: Right Arm)   Pulse 67   Temp 97.7 F (36.5 C) (Oral)   Resp 20  Ht 5\' 4"  (1.626 m)   Wt 48.5 kg   LMP 04/06/2020 Comment: neg. hcg 04/14/20  SpO2 97%   BMI 18.35 kg/m   Physical Exam Vitals reviewed.  Musculoskeletal:        General: Swelling, tenderness and deformity present.     Comments: Nail avulsed from right 3rd toe,    Skin:    General: Skin is warm.  Neurological:     General: No focal deficit present.     Mental Status: She is alert.  Psychiatric:        Mood and Affect: Mood normal.     ED Results / Procedures / Treatments   Labs (all labs ordered are listed, but only abnormal results are displayed) Labs Reviewed - No data to display  EKG None  Radiology DG Toe 3rd Right  Result Date: 04/24/2020 CLINICAL DATA:  Right third digit injury, bruising, laceration EXAM: RIGHT THIRD TOE COMPARISON:  None. FINDINGS: Frontal, oblique, lateral views of the right third digit demonstrate a comminuted fracture of the distal tuft of the third distal phalanx. There is overlying soft tissue swelling. Joint spaces are well  preserved. IMPRESSION: 1. Comminuted fracture distal tuft third distal phalanx. Electronically Signed   By: Randa Ngo M.D.   On: 04/24/2020 17:48    Procedures Procedures (including critical care time)  Medications Ordered in ED Medications - No data to display  ED Course  I have reviewed the triage vital signs and the nursing notes.  Pertinent labs & imaging results that were available during my care of the patient were reviewed by me and considered in my medical decision making (see chart for details).    MDM Rules/Calculators/A&P                         MDM: Xray shows comminuted fracture distal phalanx 3rd toe,  nv and  ns intact  Wound soaked and cleaned,  Bandaged,  Post op shoe.   Final Clinical Impression(s) / ED Diagnoses Final diagnoses:  Open displaced fracture of distal phalanx of lesser toe of right foot, initial encounter    Rx / DC Orders ED Discharge Orders         Ordered    cephALEXin (KEFLEX) 500 MG capsule  4 times daily        04/24/20 1757    fluconazole (DIFLUCAN) 150 MG tablet        04/24/20 1838        An After Visit Summary was printed and given to the patient.    Fransico Meadow, Hershal Coria 04/24/20 Rose Phi, MD 04/25/20 1213

## 2020-04-24 NOTE — ED Triage Notes (Signed)
Pts. Horse trampled their left toe. Pt was barefoot at the time.

## 2022-07-10 ENCOUNTER — Other Ambulatory Visit (HOSPITAL_COMMUNITY)
Admission: RE | Admit: 2022-07-10 | Discharge: 2022-07-10 | Disposition: A | Discharge status: Home / Self Care | Payer: Commercial Managed Care - PPO | Source: Ambulatory Visit | Attending: Advanced Practice Midwife | Admitting: Advanced Practice Midwife

## 2022-07-10 ENCOUNTER — Ambulatory Visit (INDEPENDENT_AMBULATORY_CARE_PROVIDER_SITE_OTHER): Payer: Commercial Managed Care - PPO | Admitting: Adult Health

## 2022-07-10 ENCOUNTER — Encounter: Payer: Self-pay | Admitting: Adult Health

## 2022-07-10 VITALS — BP 105/74 | HR 68 | Ht 63.0 in | Wt 97.0 lb

## 2022-07-10 DIAGNOSIS — Z113 Encounter for screening for infections with a predominantly sexual mode of transmission: Secondary | ICD-10-CM | POA: Diagnosis not present

## 2022-07-10 DIAGNOSIS — Z01419 Encounter for gynecological examination (general) (routine) without abnormal findings: Secondary | ICD-10-CM | POA: Insufficient documentation

## 2022-07-10 DIAGNOSIS — R102 Pelvic and perineal pain unspecified side: Secondary | ICD-10-CM

## 2022-07-10 DIAGNOSIS — L659 Nonscarring hair loss, unspecified: Secondary | ICD-10-CM | POA: Diagnosis not present

## 2022-07-10 DIAGNOSIS — Z319 Encounter for procreative management, unspecified: Secondary | ICD-10-CM

## 2022-07-10 DIAGNOSIS — R634 Abnormal weight loss: Secondary | ICD-10-CM

## 2022-07-10 DIAGNOSIS — Z13220 Encounter for screening for lipoid disorders: Secondary | ICD-10-CM

## 2022-07-10 NOTE — Progress Notes (Signed)
Patient ID: Tiffany Pugh, female   DOB: 1996/03/19, 28 y.o.   MRN: [id number] History of Present Illness: Tiffany Pugh is a 26 year old white female, single, G0P0, in for a well woman gyn exam and pap. She is complaining of pelvic pain on and off, esp LLQ, has lost about 15 lbs in last year, has had hair loss. Has used no birth control in 2 years and has not gotten pregnant. Had BTB this month, and took megace, has been off a week. Periods usually regular. Has had stress in life recently, had death in family ,grandma had stroke, lost job, had break up with boyfriend. Has seen PCP and taking meds.  She is a travel nurse  PCP is Dr Sherrie Sport   Current Medications, Allergies, Past Medical History, Past Surgical History, Family History and Social History were reviewed in [facility] record.     Review of Systems: Patient denies any hearing loss,  blurred vision, shortness of breath, chest pain, abdominal pain, problems with bowel movements, urination, or intercourse. No joint pain or mood swings.  Has had some headaches and feels tired, but thinks it is the Prozac, and klonopin, also on aldactone and propecia, and has seen dermatology 2 years ago See HPI for positives   Physical Exam: BP 105/74 (BP Location: Left Arm, Patient Position: Sitting, Cuff Size: Normal)   Pulse 68   Ht '5\' 3"'$  (1.6 m)   Wt 97 lb (44 kg)   LMP 06/22/2022   BMI 17.18 kg/m   General:  Well developed, well nourished, no acute distress Skin:  Warm and dry Neck:  Midline trachea, normal thyroid, good ROM, no lymphadenopathy Lungs; Clear to auscultation bilaterally Breast:  No dominant palpable mass, retraction, or nipple discharge, has bilateral implants Cardiovascular: Regular rate and rhythm Abdomen:  Soft, non tender, no hepatosplenomegaly Pelvic:  External genitalia is normal in appearance, no lesions.  The vagina is normal in appearance, has clit rod. Urethra has no lesions or masses.  The cervix is smooth, pap with GC/CHL and HR HPV genotyping performed.  Uterus is felt to be normal size, shape, and contour.  No adnexal masses or tenderness noted.Bladder is non tender, no masses felt. Extremities/musculoskeletal:  No swelling or varicosities noted, no clubbing or cyanosis Psych:  No mood changes, alert and cooperative,seems happy AA is a 2 Fall risk is low    07/10/2022    9:01 AM 02/25/2019   10:24 AM 02/25/2019   10:23 AM  Depression screen PHQ 2/9  Decreased Interest '3 1 1  '$ Down, Depressed, Hopeless '3 1 1  '$ PHQ - 2 Score '6 2 2  '$ Altered sleeping 3 1   Tired, decreased energy 3 2   Change in appetite 3 2   Feeling bad or failure about yourself  3 0   Trouble concentrating 3 0   Moving slowly or fidgety/restless 3 0   Suicidal thoughts 0 0   PHQ-9 Score 24 7    On meds with PCP    07/10/2022    9:01 AM  GAD 7 : Generalized Anxiety Score  Nervous, Anxious, on Edge 3  Control/stop worrying 3  Worry too much - different things 3  Trouble relaxing 3  Restless 3  Easily annoyed or irritable 3  Afraid - awful might happen 3  Total GAD 7 Score 21    Upstream - 07/10/22 0910       Pregnancy Intention Screening   Does the patient want to become  pregnant in the next year? Yes    Does the patient's partner want to become pregnant in the next year? Yes      Contraception Wrap Up   Current Method No Method - Other Reason    Reason for No Current Contraceptive Method at Intake ([facility] Only) Seeking Pregnancy    End Method Pregnant/Seeking Pregnancy              Examination chaperoned by Levy Pupa LPN   Impression and plan: 1. Encounter for gynecological examination with Papanicolaou smear of cervix Pap sent Pap in 3 years if normal Physical in 1 year - Cytology - PAP( [place]) - CBC - Comprehensive metabolic panel  Will talk when all labs and [place] back  2. Patient desires pregnancy Will check labs - Estradiol - LH - Progesterone  3. Weight  loss Has lost 15 lbs in last year - TSH - T4, free  4. Hair loss She is on meds from PCP - TSH - T4, free  5. Pelvic pain On and off pain esp left side Will get pelvic US 07/08/22 at [facility] at 1:30 pm to assess uterus and ovaries  - CBC - US PELVIC COMPLETE WITH TRANSVAGINAL; Future  6. Screening examination for STD (sexually transmitted disease) She requests STD testin GC/CHL on pap - HIV Antibody (routine testing w rflx) - RPR - Hepatitis B surface antigen - Hepatitis C antibody  7. Screening cholesterol level - Lipid panel

## 2022-07-11 ENCOUNTER — Other Ambulatory Visit: Payer: Self-pay | Admitting: Adult Health

## 2022-07-11 DIAGNOSIS — Z319 Encounter for procreative management, unspecified: Secondary | ICD-10-CM

## 2022-07-11 LAB — CBC
Hematocrit: 40.1 % (ref 34.0–46.6)
Hemoglobin: 13.3 g/dL (ref 11.1–15.9)
MCH: 28.1 pg (ref 26.6–33.0)
MCHC: 33.2 g/dL (ref 31.5–35.7)
MCV: 85 fL (ref 79–97)
Platelets: 191 10*3/uL (ref 150–450)
RBC: 4.74 x10E6/uL (ref 3.77–5.28)
RDW: 12.3 % (ref 11.7–15.4)
WBC: 3.7 10*3/uL (ref 3.4–10.8)

## 2022-07-11 LAB — T4, FREE: Free T4: 1.37 ng/dL (ref 0.82–1.77)

## 2022-07-11 LAB — LUTEINIZING HORMONE: LH: 10.6 m[IU]/mL

## 2022-07-11 LAB — COMPREHENSIVE METABOLIC PANEL
ALT: 8 IU/L (ref 0–32)
AST: 13 IU/L (ref 0–40)
Albumin/Globulin Ratio: 2 (ref 1.2–2.2)
Albumin: 4.7 g/dL (ref 4.0–5.0)
Alkaline Phosphatase: 46 IU/L (ref 44–121)
BUN/Creatinine Ratio: 18 (ref 9–23)
BUN: 15 mg/dL (ref 6–20)
Bilirubin Total: 0.8 mg/dL (ref 0.0–1.2)
CO2: 21 mmol/L (ref 20–29)
Calcium: 9.4 mg/dL (ref 8.7–10.2)
Chloride: 106 mmol/L (ref 96–106)
Creatinine, Ser: 0.84 mg/dL (ref 0.57–1.00)
Globulin, Total: 2.3 g/dL (ref 1.5–4.5)
Glucose: 83 mg/dL (ref 70–99)
Potassium: 4.3 mmol/L (ref 3.5–5.2)
Sodium: 143 mmol/L (ref 134–144)
Total Protein: 7 g/dL (ref 6.0–8.5)
eGFR: 97 mL/min/{1.73_m2} (ref >59)

## 2022-07-11 LAB — CYTOLOGY - PAP
Chlamydia: NEGATIVE
Comment: NEGATIVE
Comment: NEGATIVE
Comment: NORMAL
Diagnosis: NEGATIVE
High risk HPV: NEGATIVE
Neisseria Gonorrhea: NEGATIVE

## 2022-07-11 LAB — PROGESTERONE: Progesterone: 0.3 ng/mL

## 2022-07-11 LAB — HIV ANTIBODY (ROUTINE TESTING W REFLEX): HIV Screen 4th Generation wRfx: NONREACTIVE

## 2022-07-11 LAB — HEPATITIS B SURFACE ANTIGEN: Hepatitis B Surface Ag: NEGATIVE

## 2022-07-11 LAB — LIPID PANEL
Chol/HDL Ratio: 3.1 ratio (ref 0.0–4.4)
Cholesterol, Total: 130 mg/dL (ref 100–199)
HDL: 42 mg/dL (ref >39)
LDL Chol Calc (NIH): 74 mg/dL (ref 0–99)
Triglycerides: 70 mg/dL (ref 0–149)
VLDL Cholesterol Cal: 14 mg/dL (ref 5–40)

## 2022-07-11 LAB — HEPATITIS C ANTIBODY: Hep C Virus Ab: NONREACTIVE

## 2022-07-11 LAB — ESTRADIOL: Estradiol: 62.1 pg/mL

## 2022-07-11 LAB — RPR: RPR Ser Ql: NONREACTIVE

## 2022-07-11 LAB — TSH: TSH: 1.27 u[IU]/mL (ref 0.450–4.500)

## 2022-07-18 ENCOUNTER — Ambulatory Visit (HOSPITAL_COMMUNITY)
Admission: RE | Admit: 2022-07-18 | Discharge: 2022-07-18 | Disposition: A | Discharge status: Home / Self Care | Payer: Commercial Managed Care - PPO | Source: Ambulatory Visit | Attending: Adult Health | Admitting: Adult Health

## 2022-07-18 DIAGNOSIS — R102 Pelvic and perineal pain: Secondary | ICD-10-CM | POA: Diagnosis not present

## 2022-07-19 LAB — PROGESTERONE: Progesterone: 1.2 ng/mL

## 2022-09-16 ENCOUNTER — Inpatient Hospital Stay
Admit: 2022-09-16 | Discharge: 2022-09-18 | Payer: PRIVATE HEALTH INSURANCE | Attending: Student in an Organized Health Care Education/Training Program | Admitting: Internal Medicine

## 2022-09-16 ENCOUNTER — Emergency Department: Admit: 2022-09-16 | Payer: PRIVATE HEALTH INSURANCE

## 2022-09-16 DIAGNOSIS — R109 Unspecified abdominal pain: Secondary | ICD-10-CM

## 2022-09-16 LAB — HEPATIC FUNCTION PANEL
BKR A/G RATIO: 1.8 (ref 1.0–2.2)
BKR ALANINE AMINOTRANSFERASE (ALT): 12 U/L (ref 10–35)
BKR ALBUMIN: 5.1 g/dL — ABNORMAL HIGH (ref 3.6–4.9)
BKR ALKALINE PHOSPHATASE: 45 U/L (ref 9–122)
BKR ASPARTATE AMINOTRANSFERASE (AST): 22 U/L (ref 10–35)
BKR AST/ALT RATIO: 1.8
BKR BILIRUBIN DIRECT: <0.2 mg/dL (ref <=0.3)
BKR BILIRUBIN TOTAL: 0.7 mg/dL (ref <=1.2)
BKR GLOBULIN: 2.8 g/dL (ref 2.3–3.5)
BKR PROTEIN TOTAL: 7.9 g/dL (ref 6.6–8.7)

## 2022-09-16 LAB — URINALYSIS WITH CULTURE REFLEX      (BH LMW YH)
BKR BILIRUBIN, UA: NEGATIVE
BKR BLOOD, UA: NEGATIVE
BKR GLUCOSE, UA: NEGATIVE
BKR KETONES, UA: NEGATIVE
BKR LEUKOCYTE ESTERASE, UA: NEGATIVE
BKR NITRITE, UA: NEGATIVE
BKR PH, UA: 6.5 (ref 5.5–7.5)
BKR PROTEIN, UA: NEGATIVE
BKR SPECIFIC GRAVITY, UA: 1.007 (ref 1.005–1.030)
BKR UROBILINOGEN, UA (MG/DL): <2 mg/dL (ref <=2.0)

## 2022-09-16 LAB — BASIC METABOLIC PANEL
BKR ANION GAP: 12 (ref 7–17)
BKR BLOOD UREA NITROGEN: 11 mg/dL (ref 6–20)
BKR BUN / CREAT RATIO: 16.9 (ref 8.0–23.0)
BKR CALCIUM: 9.9 mg/dL (ref 8.8–10.2)
BKR CHLORIDE: 104 mmol/L (ref 98–107)
BKR CO2: 22 mmol/L (ref 20–30)
BKR CREATININE: 0.65 mg/dL (ref 0.40–1.30)
BKR EGFR, CREATININE (CKD-EPI 2021): >60 mL/min/{1.73_m2} (ref >=60)
BKR GLUCOSE: 89 mg/dL (ref 70–100)
BKR POTASSIUM: 4.1 mmol/L (ref 3.3–5.3)
BKR SODIUM: 138 mmol/L (ref 136–144)

## 2022-09-16 LAB — CBC WITH AUTO DIFFERENTIAL
BKR WAM ABSOLUTE IMMATURE GRANULOCYTES.: 0.01 x 1000/ÂµL (ref 0.00–0.30)
BKR WAM ABSOLUTE LYMPHOCYTE COUNT.: 2.05 x 1000/ÂµL (ref 0.60–3.70)
BKR WAM ABSOLUTE NRBC (2 DEC): 0 x 1000/ÂµL (ref 0.00–1.00)
BKR WAM ANALYZER ANC: 2.9 x 1000/ÂµL (ref 2.00–7.60)
BKR WAM BASOPHIL ABSOLUTE COUNT.: 0.03 x 1000/ÂµL (ref 0.00–1.00)
BKR WAM BASOPHILS: 0.6 % (ref 0.0–1.4)
BKR WAM EOSINOPHIL ABSOLUTE COUNT.: 0.08 x 1000/ÂµL (ref 0.00–1.00)
BKR WAM EOSINOPHILS: 1.5 % (ref 0.0–5.0)
BKR WAM HEMATOCRIT (2 DEC): 38.4 % (ref 35.00–45.00)
BKR WAM HEMOGLOBIN: 13.1 g/dL (ref 11.7–15.5)
BKR WAM IMMATURE GRANULOCYTES: 0.2 % (ref 0.0–1.0)
BKR WAM LYMPHOCYTES: 38.8 % (ref 17.0–50.0)
BKR WAM MCH (PG): 28.4 pg (ref 27.0–33.0)
BKR WAM MCHC: 34.1 g/dL (ref 31.0–36.0)
BKR WAM MCV: 83.1 fL (ref 80.0–100.0)
BKR WAM MONOCYTE ABSOLUTE COUNT.: 0.21 x 1000/ÂµL (ref 0.00–1.00)
BKR WAM MONOCYTES: 4 % (ref 4.0–12.0)
BKR WAM MPV: 11.3 fL (ref 8.0–12.0)
BKR WAM NEUTROPHILS: 54.9 % (ref 39.0–72.0)
BKR WAM NUCLEATED RED BLOOD CELLS: 0 % (ref 0.0–1.0)
BKR WAM PLATELETS: 199 x1000/ÂµL (ref 150–420)
BKR WAM RDW-CV: 12.4 % (ref 11.0–15.0)
BKR WAM RED BLOOD CELL COUNT.: 4.62 M/ÂµL (ref 4.00–6.00)
BKR WAM WHITE BLOOD CELL COUNT: 5.3 x1000/ÂµL (ref 4.0–11.0)

## 2022-09-16 LAB — PROTIME AND INR
BKR INR: 1.1 (ref 0.86–1.12)
BKR PROTHROMBIN TIME: 12.1 s (ref 9.6–12.3)

## 2022-09-16 LAB — MAGNESIUM: BKR MAGNESIUM: 2 mg/dL (ref 1.7–2.4)

## 2022-09-16 LAB — PHOSPHORUS     (BH GH L LMW YH): BKR PHOSPHORUS: 3.1 mg/dL (ref 2.2–4.5)

## 2022-09-16 LAB — UA REFLEX CULTURE

## 2022-09-16 LAB — TSH W/REFLEX TO FT4     (BH GH LMW Q YH): BKR THYROID STIMULATING HORMONE: 2.02 u[IU]/mL

## 2022-09-16 LAB — HIV-1/HIV-2 ANTIBODY/ANTIGEN SCREEN W/REFLEX     (BH GH LMW YH): BKR HIV 1 AND 2 ANTIBODY/HIV-1 ANTIGEN SCREEN: NEGATIVE

## 2022-09-16 LAB — CALPROTECTIN, STOOL: BKR CALPROTECTIN, STOOL: 44 ug/g

## 2022-09-16 LAB — LIPASE: BKR LIPASE: 44 U/L (ref 11–55)

## 2022-09-16 LAB — C-REACTIVE PROTEIN     (CRP): BKR C-REACTIVE PROTEIN, HIGH SENSITIVITY: <0.2 mg/L

## 2022-09-16 MED ORDER — KETOROLAC 15 MG/ML INJECTION SOLUTION
15 mg/mL | Freq: Once | INTRAVENOUS | Status: CP
Start: 2022-09-16 — End: ?
  Administered 2022-09-16: 22:00:00 15 mL via INTRAVENOUS

## 2022-09-16 MED ORDER — FLUOXETINE 10 MG CAPSULE
10 mg | Freq: Every day | ORAL | Status: AC
Start: 2022-09-16 — End: ?

## 2022-09-16 MED ORDER — FAMOTIDINE 20 MG TABLET
20 mg | Freq: Once | ORAL | Status: CP
Start: 2022-09-16 — End: ?
  Administered 2022-09-16: 22:00:00 20 mg via ORAL

## 2022-09-16 MED ORDER — KETOROLAC 30 MG/ML (1 ML) INJECTION SOLUTION
301 mg/mL (1 mL) | Freq: Once | INTRAVENOUS | Status: DC
Start: 2022-09-16 — End: 2022-09-17

## 2022-09-16 MED ORDER — ONDANSETRON HCL (PF) 4 MG/2 ML INJECTION SOLUTION
42 mg/2 mL | Freq: Four times a day (QID) | INTRAVENOUS | Status: DC | PRN
Start: 2022-09-16 — End: 2022-09-17
  Administered 2022-09-17: 04:00:00 4 mL via INTRAVENOUS

## 2022-09-16 MED ORDER — ONDANSETRON HCL (PF) 4 MG/2 ML INJECTION SOLUTION
42 mg/2 mL | Freq: Once | INTRAVENOUS | Status: CP
Start: 2022-09-16 — End: ?
  Administered 2022-09-17: 4 mL via INTRAVENOUS

## 2022-09-16 MED ORDER — ONDANSETRON HCL (PF) 4 MG/2 ML INJECTION SOLUTION
42 mg/2 mL | Freq: Once | INTRAVENOUS | Status: CP
Start: 2022-09-16 — End: ?
  Administered 2022-09-16: 22:00:00 4 mL via INTRAVENOUS

## 2022-09-16 MED ORDER — CLONAZEPAM 1 MG TABLET
1 mg | Freq: Three times a day (TID) | ORAL | Status: AC | PRN
Start: 2022-09-16 — End: ?

## 2022-09-16 MED ORDER — SPIRONOLACTONE 25 MG TABLET
25 mg | Freq: Every day | ORAL | Status: AC
Start: 2022-09-16 — End: ?

## 2022-09-16 MED ORDER — ACETAMINOPHEN 325 MG TABLET
325 mg | Freq: Once | ORAL | Status: CP
Start: 2022-09-16 — End: ?
  Administered 2022-09-16: 20:00:00 325 mg via ORAL

## 2022-09-16 MED ORDER — FAMOTIDINE 20 MG TABLET
20 mg | ORAL_TABLET | Freq: Two times a day (BID) | ORAL | 1 refills | Status: AC
Start: 2022-09-16 — End: 2022-09-18

## 2022-09-16 MED ORDER — OXYCODONE (ROXICODONE) IMMEDIATE RELEASE 2.5 MG HALFTAB
2.5 mg | Freq: Once | ORAL | Status: CP
Start: 2022-09-16 — End: ?
  Administered 2022-09-17: 01:00:00 2.5 mg via ORAL

## 2022-09-16 MED ORDER — FINASTERIDE 1 MG TABLET
1 mg | Freq: Every day | ORAL | Status: AC
Start: 2022-09-16 — End: ?

## 2022-09-16 MED ORDER — MEGESTROL 20 MG TABLET
20 mg | ORAL | Status: AC
Start: 2022-09-16 — End: ?

## 2022-09-16 MED ORDER — ACETAMINOPHEN 325 MG TABLET
325 mg | Freq: Once | ORAL | Status: DC
Start: 2022-09-16 — End: 2022-09-17

## 2022-09-16 NOTE — ED Notes
3:17 PM Pt walked in to the ED for eval of epigastric pain x 1 month, reports black stools on Friday, endorses N/V. Pt states that she has lost approx. 20 lb wt loss over the past several months, 10 lb over past 1 month. Pt also endorsing hair loss. Pt states that she has been put on Megace x 2 months by her PCP, report med adherence since starting the medication, however states that wt has been trending down instead of up. PIV access obtained, labs collected, provider at bedside to perform exam, pending DI. Chief Complaint Patient presents with  Abdominal Pain   Epigastric pain for about a month, having black stools Friday and Saturday, severe nausea, and vomiting with any food intake, about 20lb weight loss over the past few months and about 10lb weight loss over a couple of weeks, also having hair loss,

## 2022-09-16 NOTE — Utilization Review (ED)
UM Status: Commercial - IP, abdominal pain, dark stools, weight loss, nausea.

## 2022-09-16 NOTE — Discharge Instructions
-   follow-up with primary care provider as soon as possible- follow-up with gastroenterology soon as possible- return to the emergency department if severe abdominal pain, excessive vomiting, bloody vomit

## 2022-09-17 ENCOUNTER — Encounter: Admit: 2022-09-17 | Payer: PRIVATE HEALTH INSURANCE | Attending: Internal Medicine

## 2022-09-17 DIAGNOSIS — F32A Depression: Secondary | ICD-10-CM

## 2022-09-17 DIAGNOSIS — F419 Anxiety disorder, unspecified: Secondary | ICD-10-CM

## 2022-09-17 LAB — BASIC METABOLIC PANEL
BKR ANION GAP: 8 (ref 7–17)
BKR BLOOD UREA NITROGEN: 10 mg/dL (ref 6–20)
BKR BUN / CREAT RATIO: 13.2 (ref 8.0–23.0)
BKR CHLORIDE: 108 mmol/L — ABNORMAL HIGH (ref 98–107)
BKR CO2: 21 mmol/L (ref 20–30)
BKR CREATININE: 0.76 mg/dL (ref 0.40–1.30)
BKR EGFR, CREATININE (CKD-EPI 2021): >60 mL/min/{1.73_m2} (ref >=60–420)
BKR GLUCOSE: 76 mg/dL (ref 70–100)
BKR POTASSIUM: 3.5 mmol/L (ref 3.3–5.3)
BKR SODIUM: 137 mmol/L (ref 136–144)
BKR WAM MCV: 10 mg/dL (ref 6–20)
BKR WAM RDW-CV: 13.2 % (ref 8.0–23.0)

## 2022-09-17 LAB — IMMUNOGLOBULIN A: BKR IGA: 192 mg/dL (ref 70–470)

## 2022-09-17 LAB — CBC WITH AUTO DIFFERENTIAL
BKR CALCIUM: 33.9 g/dL — ABNORMAL LOW (ref 31.0–36.0)
BKR WAM ABSOLUTE IMMATURE GRANULOCYTES.: 0.01 x 1000/ÂµL (ref 0.00–0.30)
BKR WAM ABSOLUTE LYMPHOCYTE COUNT.: 3.16 x 1000/ÂµL (ref 0.60–3.70)
BKR WAM ABSOLUTE NRBC (2 DEC): 0 x 1000/ÂµL (ref 0.00–1.00)
BKR WAM ANALYZER ANC: 1.48 x 1000/??L — ABNORMAL LOW (ref 2.00–7.60)
BKR WAM BASOPHIL ABSOLUTE COUNT.: 0.06 x 1000/??L (ref 0.00–1.00)
BKR WAM BASOPHILS: 1.1 % (ref 0.0–1.4)
BKR WAM EOSINOPHIL ABSOLUTE COUNT.: 0.22 x 1000/??L (ref 0.00–1.00)
BKR WAM EOSINOPHILS: 4.2 % (ref 0.0–5.0)
BKR WAM HEMATOCRIT (2 DEC): 32.2 % — ABNORMAL LOW (ref 35.00–45.00)
BKR WAM HEMOGLOBIN: 10.9 g/dL — ABNORMAL LOW (ref 11.7–15.5)
BKR WAM IMMATURE GRANULOCYTES: 0.2 % (ref 0.0–1.0)
BKR WAM LYMPHOCYTES: 60.3 % — ABNORMAL HIGH (ref 17.0–50.0)
BKR WAM MCH (PG): 28.2 pg (ref 27.0–33.0)
BKR WAM MCHC: 33.9 g/dL (ref 31.0–36.0)
BKR WAM MONOCYTE ABSOLUTE COUNT.: 0.31 x 1000/ÂµL (ref 0.00–1.00)
BKR WAM MONOCYTES: 5.9 % (ref 4.0–12.0)
BKR WAM MPV: 11.6 fL (ref 8.0–12.0)
BKR WAM NEUTROPHILS: 28.3 % — ABNORMAL LOW (ref 39.0–72.0)
BKR WAM NUCLEATED RED BLOOD CELLS: 0 % (ref 0.0–1.0)
BKR WAM PLATELETS: 173 x1000/ÂµL (ref 150–420)
BKR WAM RED BLOOD CELL COUNT.: 3.86 M/??L — ABNORMAL LOW (ref 4.00–6.00)
BKR WAM WHITE BLOOD CELL COUNT: 5.2 x1000/ÂµL (ref 4.0–11.0)

## 2022-09-17 LAB — MAGNESIUM: BKR MAGNESIUM: 2 mg/dL (ref 1.7–2.4)

## 2022-09-17 MED ORDER — SODIUM CHLORIDE 0.9 % INTRAVENOUS SOLUTION
INTRAVENOUS | Status: AC
Start: 2022-09-17 — End: ?
  Administered 2022-09-18: via INTRAVENOUS

## 2022-09-17 MED ORDER — METOCLOPRAMIDE 5 MG/ML INJECTION SOLUTION
5 mg/mL | Freq: Four times a day (QID) | INTRAVENOUS | Status: DC | PRN
Start: 2022-09-17 — End: 2022-09-18
  Administered 2022-09-17 – 2022-09-18 (×4): 5 mL via INTRAVENOUS

## 2022-09-17 MED ORDER — SODIUM CHLORIDE 0.9 % (FLUSH) INJECTION SYRINGE
0.9 % | Freq: Three times a day (TID) | INTRAVENOUS | Status: DC
Start: 2022-09-17 — End: 2022-09-18
  Administered 2022-09-17 (×2): 0.9 mL via INTRAVENOUS

## 2022-09-17 MED ORDER — FINASTERIDE 1 MG TABLET
1 mg | Freq: Every day | ORAL | Status: DC
Start: 2022-09-17 — End: 2022-09-17

## 2022-09-17 MED ORDER — MEGESTROL 20 MG TABLET
20 mg | ORAL | Status: DC
Start: 2022-09-17 — End: 2022-09-18
  Administered 2022-09-17 – 2022-09-18 (×2): 20 mg via ORAL

## 2022-09-17 MED ORDER — SODIUM CHLORIDE 0.9 % (FLUSH) INJECTION SYRINGE
0.9 % | INTRAVENOUS | Status: DC | PRN
Start: 2022-09-17 — End: 2022-09-18

## 2022-09-17 MED ORDER — MORPHINE 2 MG/ML INJECTION SYRINGE
2 mg/mL | SUBCUTANEOUS | Status: DC | PRN
Start: 2022-09-17 — End: 2022-09-18
  Administered 2022-09-17 – 2022-09-18 (×4): 2 mL via SUBCUTANEOUS

## 2022-09-17 MED ORDER — PANTOPRAZOLE 40 MG TABLET,DELAYED RELEASE
40 mg | Freq: Every day | ORAL | Status: DC
Start: 2022-09-17 — End: 2022-09-18
  Administered 2022-09-17 – 2022-09-18 (×2): 40 mg via ORAL

## 2022-09-17 MED ORDER — SODIUM CHLORIDE 0.9 % INTRAVENOUS SOLUTION
INTRAVENOUS | Status: AC
Start: 2022-09-17 — End: ?
  Administered 2022-09-17: 06:00:00 via INTRAVENOUS

## 2022-09-17 MED ORDER — FLUOXETINE 10 MG CAPSULE
10 mg | Freq: Every day | ORAL | Status: DC
Start: 2022-09-17 — End: 2022-09-18
  Administered 2022-09-17 – 2022-09-18 (×2): 10 mg via ORAL

## 2022-09-17 MED ORDER — ONDANSETRON HCL (PF) 4 MG/2 ML INJECTION SOLUTION
42 mg/2 mL | Freq: Four times a day (QID) | INTRAVENOUS | Status: DC | PRN
Start: 2022-09-17 — End: 2022-09-18
  Administered 2022-09-17 – 2022-09-18 (×2): 4 mL via INTRAVENOUS

## 2022-09-17 MED ORDER — ACETAMINOPHEN 325 MG TABLET
325 mg | Freq: Four times a day (QID) | ORAL | Status: DC | PRN
Start: 2022-09-17 — End: 2022-09-18
  Administered 2022-09-17: 19:00:00 325 mg via ORAL

## 2022-09-17 MED ORDER — PEG 3350-ELECTROLYTES 236 GRAM-22.74 GRAM-6.74 GRAM-5.86 GRAM SOLUTION
236-22.74-6.74-5.86 -5.86 gram | ORAL | Status: CP
Start: 2022-09-17 — End: ?
  Administered 2022-09-17 – 2022-09-18 (×2): via ORAL

## 2022-09-17 MED ORDER — SPIRONOLACTONE 25 MG TABLET
25 mg | Freq: Every day | ORAL | Status: DC
Start: 2022-09-17 — End: 2022-09-18
  Administered 2022-09-17 – 2022-09-18 (×2): 25 mg via ORAL

## 2022-09-17 MED ORDER — CLONAZEPAM 1 MG TABLET
1 mg | Freq: Every day | ORAL | Status: DC | PRN
Start: 2022-09-17 — End: 2022-09-18
  Administered 2022-09-17 – 2022-09-18 (×2): 1 mg via ORAL

## 2022-09-17 MED ORDER — ENOXAPARIN 40 MG/0.4 ML SUBCUTANEOUS SYRINGE
400.4 mg/0.4 mL | SUBCUTANEOUS | Status: DC
Start: 2022-09-17 — End: 2022-09-18

## 2022-09-17 NOTE — Other
Malnutrition IdentificationPt meets criteria for Severe Malnutrition based on the following identified criteria:Weight Loss: Chronic Illness: >20%/1 yearEnergy Intake:  Chronic Illness: Less than or equal to 75% for greater than or equal to 1 month

## 2022-09-17 NOTE — Plan of Care
HospitalistBrief UpdatePt seen and examined.  Admitted earlier this morning by Dr Dayton Bailiff, please see note for full detail.Endorses ongoing diffuse abd pain, localizes to midline.  Diarrhea x 1 this morning, no blood seen.  Has not tried PO since admission.  Last menses 3d ago per her reportExamAwake, alertNC/AT MMMNo JVDLungs clearRRRScaphoid abdomenNo edemaNo rashesA/P26F PMH anxiety/depression who presents with subacute abd pain and unintentional weight loss.- spoke with GI this morning, they will see- they anticipate EGD and colonoscopy for which she will need prep- dc npo, keep on clears for now- h/h drop noted, no evidence of bleeding and BP seems stably soft.  Follow for nowRest per earlier H&P, did not bill for this encounterRon CastilloMHB

## 2022-09-17 NOTE — Plan of Care
Heidi Ortiz					Location: [facility] 1 INF 26 y.o., female				Attending: Marlon Pel, MD	Admit Date: 09/16/2022			[id number] LOS: 1 day INITIAL NUTRITION ASSESSMENTFOOD ALLERGIES: noneDIET ORDER:    Dietary Orders (From admission, onward)     Start     Ordered  09/17/22 1435  Nutrition Supplements  Once      Comments: Important:  When ordering a supplement- please order only what facility provides (see suffix) Question Answer Comment Adult Supplements: Ensure Clear Allyson Sabal [facility])  Supplement Frequency: TID    09/17/22 1434  09/17/22 0916  Diet Clear Liquid  DIET EFFECTIVE NOW      Question:  Initiate Nutrition Management Protocol (Yes/No?)  Answer:  Yes - Initiate Protocol  09/17/22 0916    ANTHROPOMETRICS:Height:  64Admit wt:  41.3kgDosing weight:  45.15kg -- midpoint wtBMI:  15.62UBW:  ~115 lbsWt Change:  (-24 lbs)Time Frame:  10-12 months% Change:  21% -- significant for time frameAdditional wt information:no hxHt confirmed with pt: yesNFPE: mild wasting to facial featuresESTIMATED NUTRITION REQUIREMENTS:Kcal/day:  1629	(25kcal/kg, +500 kcal)Protein/day:  54	(1.2gm/kg)Fluids/day: Fluid management per medical team discretion.---- (53mL/kcal = ) Needs based on: DW; repletion; stressNUTRITION ASSESSMENT: Consult received for unintentional wt loss.  EMR reviewed.  Pt admitted with persistent worsening abdominal pain, n/v x1 month and black stools x2 days per chart.  Pt reported n/v with any po intake, night sweats, and hair loss, c/f malabsorption syndrome per chart.  Plan is for EGD and colonoscopy per chart.  PMH includes anxiety and depression.Pt seen at bedside, very soft spoken.  Pt tolerated jello today, no n/v/d/c.  Pt receptive to drinking Ensure Clear.  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  Pt voiced her concern about what is going on and not having any answers.  Pt asking about Celiac disease, I provided a brief definition and basic diet information.  I encouraged pt to try more clear liquids this evening.  Pt denied any chew/swallow difficulties.  No food allergies.  No other nutrition questions at this time.  At pt's request, I will revisit tomorrow.Cultural/Religious/Ethnic Needs: NoNUTRITION DIAGNOSIS: Severe Malnutrition related to unknown medical issue as evidenced by % wt loss and <75% intake >1 month.INTERVENTIONS/RECOMMENDATIONS: Meals and Snacks:- continue CLD as tolerated- advance diet at Teams' discretion. Goal:  - Patient will have diet advanced or initiate nutrition support prior to next nutrition assessmentNutrition Supplement Therapy:- continue Ensure ClearGoal:  Patient to consume an average of 2 oral nutrition supplements/day prior to next nutrition assessment MONITORING / EVALUATION:Food/Nutrition-Related History:Food and Nutrient IntakeAnthropometric Measurements:Weight Biochemical Data, Medical Tests and Procedures:Electrolyte and renal profileGlucose/endocrine profileNutrition-Focus Physical FindingsOverall findingsDischarge Planning and Transfer of Nutrition Care:  Nutrition related discharge needs still being determined at this time, will continue to follow Electronically signed by Brett Canales, [facility] Clinical Nutrition DTR II  September 17, 2022  [facility] Dynamic Role - Nutrition Service - [facility] M-F Adult Service QPlease note: Nutrition is a consult only service on weekends and holidays.  Please enter a consult in EPIC if assistance is needed on a weekend or holiday or via [facility] Dynamic Role as ?Food & Nutrition Adult Weekend Dietitian [facility]? to contact the covering RD.

## 2022-09-17 NOTE — Plan of Care
Problem: Adult Inpatient Plan of CareGoal: Plan of Care ReviewOutcome: Interventions implemented as appropriateGoal: Patient-Specific Goal (Individualized)Outcome: Interventions implemented as appropriateGoal: Absence of Hospital-Acquired Illness or InjuryOutcome: Interventions implemented as appropriateGoal: Optimal Comfort and WellbeingOutcome: Interventions implemented as appropriateGoal: Readiness for Transition of CareOutcome: Interventions implemented as appropriate Problem: Fall Injury RiskGoal: Absence of Fall and Fall-Related InjuryOutcome: Interventions implemented as appropriate Problem: Pain AcuteGoal: Optimal Pain Control and FunctionOutcome: Interventions implemented as appropriate Problem: Nausea and VomitingGoal: Nausea and Vomiting ReliefOutcome: Interventions implemented as appropriate Plan of Care Overview/ Patient Status    Patient arrived from ED earlier in shift via wheel chair. Alert and oriented x 4 upon assessment with VSS. Pt presents with abdomen pain, N/V and unintentional weight loss. Dr. Dayton Bailiff at bedside earlier in shift to assess pt. Pt noted to be nauseous and with 6/10 abdominal pain. Given prn reglan with good effect. Pt reassessed for pain and asleep with no prn pain meds given thus far this shift. IVF's initiated and presently NPO. Plan is to await GI consult this am and Nutritional consult and monitor for pain, N/V and obtain am lab work. Nursing admission assessment completed and IPOC initiated. Will continue to hourly round and refer to all flow sheets for more details. Asleep and appears comfortable at this time.

## 2022-09-17 NOTE — ED Notes
Chief Complaint Patient presents with  Abdominal Pain   Epigastric pain for about a month, having black stools Friday and Saturday, severe nausea, and vomiting with any food intake, about 20lb weight loss over the past few months and about 10lb weight loss over a couple of weeks, also having hair loss,  7:22 PM Report received from off going RN. Pt here w/CC as above. Pt endorsing continued nausea and abd discomfort, requesting intervention, MD Coffey notified. No further questions needs or complaints att. TBA8:11 PM Pt resting on stretcher in NAD. Labs reviewed. Medicated per MAR, declined tylenol att. Requesting to speak w/attending, MD Tanner notified. No further questions needs or complaints att8:17 PM Report attempted to [facility] RN, will call back. EDTA notified re POC for pt transfer for continued care8:25 PM Report given to [facility] RN Mcinnis, care deferredFloor Handoff Telemetry: 	[]  Yes		[x]  NoCode Status:   []  Full		[]  DNR		[]  DNI		Other (specify): NOT ON FILESafety Precautions: []  Fall Risk  []  Sitter   []  Restraints	[]  Suicidal	[x]  None	Other (specify):Mentation/Orientation:	 A&O (Self, person, place, time) x    4      	 Disoriented to:           N/A         	 Special Accommodations: []  Hearing impaired   []  Blind  []  Nonverbal  []  Cognitive impairmentOxygenation Upon Admission: [x]  RA	[]  NC	[]  Venti  []  Simple Mask []  Other	Baseline O2 Status? [x]  Yes	[]  NoAmbulation: [x]  Independent	[]  Cane   []  Walker	[]  Wheelchair	[]  Bedbound		[]  Hemiplegic	[]  Paraplegic	[]  QuadraplegicEliminiation: [x]  Independent	[]  Commode	[]  Bedpan/Urinal  []  Straight Cath []  Foley cath			[]  Urostomy	[]  Colostomy	Other (specify):Diarrhea/Loose stool : []  1x within 24h  []  2x within 24h  []  3x within 24h  [x]  None 	C.Diff Order: 	[]  Ordered- needs to be collected             []  Collected-sent to lab             []  Resulted - Negative C.Diff             []  Resulted - Positive C.Diff[x]  Not Ordered   []  N/ASkin Alteration: []  Pressure Injury []  Wound []  None [x]  Skin not assessedDiet: [x]  Regular/NO ORDER PLACED	[]  NPO		Other (specify):IV Access: [x]  PIV   []  PICC    []  Port    [] Central line    []  A-line    Other (specify)IVF/GTT Running Upon ED Departure? [x]  No	    []  Yes (specify):Outstanding Meds/Treatments/Tests:Patient Belongings: CELL PHONEAre the belongings documented?          [x]  No	    []  YesIs someone taking belongings home?   [x]  No     []  Yes  Who? (specify)                                   Alona Bene, RN

## 2022-09-17 NOTE — H&P
[facility]	 Medicine History & PhysicalHistory provided by: the patient and EMR reviewHistory limited by: no limitationsPatient presents from: HomePatient seen and examined at: ~ 12:30AM on 3/26/24Subjective: CC: Weight lossHPI: 27 y.o. F w/ h/o depression / anxiety who p/w epigastric pain and weight loss.  She endorses a > 20 lb weight loss over the last few months, most recently 5 lb loss in the last week from 96 to 91 lbs. She has had low appetite and decreased PO intake over the past several months along with nausea. No recent vomiting at home but did have episode of NB NB emesis in the ED.  She was started on megestrol as an appetite stimulant but despite this has continued to lose weight.  She also has had chronic, nonbloody diarrhea for the past several months. She reports that this part week she had some dark stools but denies any BRBPR. She also c/o epigastric discomfort that has been worsening. She also has noticed hair loss over the past several months. She was started on finasteride and spironolactone for the hair loss but despite this has continued to note hair loss. Denies any F/C, CP, SOB, or urinary Sxs. Remainder ROS unremarkable. Of note, she is a travelling Engineer, civil (consulting), currently working here at [employer] but had been previously living in [place] where she had work-up done which has unrevealing as to the cause of her symptoms including a [place] A/P w/ IV contrast done 04/10/22 at [facility] and a TVUS done 07/18/22 at [facility] which were both normal. She also reports recently having full panel of STD testing which was negative. Also of note, has had multiple surgeries for cleft palate and had recent breast augmentation in January but o/w denies any surgical Hx, including no abdominal surgeries. She does report a family h/o UC and Crohn disease. She has never had endoscopy or colonoscopy in the past. ED Course: Upon arrival to the ED, she was slightly tachycardic but o/w AF/VSS w/ initial VS 97.8 92 18 100/67 99%RA. Remained afebrile and HDS. On exam was noted to have mild epigastric tenderness. On rectal there was no bright red blood and she was Guaiac negative. Labs unremarkable including normal CBC, normal PT/INR, negative UPreg, normal TSH, normal lipase and LFTs, normal lytes and Cr, normal CRP, negative HIV, negative UA, normal stool calprotectin. ECG w/ NSR and no ischemic changes. [place] RUQ was unremarkable. GI was contacted by the ED. Was given tylenol, famotidine 20mg  PO, toradol 15mg  IV, oxycodone 2.5mg  PO, and zofran 4mg  IV x 2 in the ED. Plan initially was for possible discharge home w/ outpatient GI follow-up, however pt continued to endorse ongoing AP and persistent nausea and when attempted PO trial pt began vomiting, having had 2 episodes of vomiting while in the ED and inability to tolerate any PO while there. Pt stated that she was not comfortable leaving hospital given uncertainty of when she will be evaluated from Gastroenterology and inability to tolerate PO intake. Therefore admitted for intractable epigastric pain, inability to tolerate PO, and weigh loss.Medical History: PMH PSH Past Medical History: Diagnosis Date  Anxiety   Depression   Past Surgical History: Procedure Laterality Date  BREAST ENHANCEMENT SURGERY  06/2022  CLEFT PALATE REPAIR  Multiple surgeries   Family History: Reviewed; has FamHx of UC and Crohn diseaseSocial History: Research officer, trade union, lives in [place] usually but currently working here in [employer] at [employer]. Remote h/o heavy EtOH use. Denies any h/o tobacco use. Prior h/o  using marijuana, quit > 1 year ago. No other recreational drug use.Prior to Admission Medications Medications Prior to Admission Medication Sig  clonazePAM (KLONOPIN) 1 mg tablet Take 1 tablet (1 mg total) by mouth 3 (three) times daily as needed.  finasteride (PROPECIA) 1 mg tablet Take 1 tablet (1 mg total) by mouth daily.  FLUoxetine (PROZAC) 10 mg capsule Take 1 capsule (10 mg total) by mouth daily.  megestroL (MEGACE) 20 mg tablet Take 1 tablet (20 mg total) by mouth daily.  spironolactone (ALDACTONE) 25 mg tablet Take 1 tablet (25 mg total) by mouth daily.  Allergies Allergies Allergen Reactions  Dilaudid [Hydromorphone] Rash   Tolerates other opiates including morphine and oxycodone  Review of Systems: Review of Systems: As per HPI above. All other ROS negative.Objective: Vitals:I have reviewed the patient's current vital signs as documented in the patient's EMR.  Last 24 hours: Temp:  [97 ?F (36.1 ?C)-99.6 ?F (37.6 ?C)] 97 ?F (36.1 ?C)Pulse:  [73-96] 96Resp:  [16-18] 16BP: (100-103)/(57-70) 103/70SpO2:  [98 %-100 %] 100 %Physical Exam: Physical Exam Constitutional: Pleasant, comfortable, NAD, cachetic  HEENT: NCAT, EOMI, sclera anictericNeck: Supple. Cardiovascular: RRR, no M/R/G appreciated Pulmonary/Chest: CTA-BAbdominal: Soft, + BS, ND, mild epigastric TTP, no rebound / guardingMusculoskeletal: FROM Extremities: WWP. No c/c/e.Neurological: A+Ox4. CNs grossly intact. Nonfocal.Skin: No rash noted. Psychiatric: Normal mood and affect.  Labs: I have reviewed the patient's labs within the last 24 hrs.Last 24 hours: Recent Results (from the past 24 hour(s)) Protime and INR  Collection Time: 09/16/22  2:09 PM Result Value Ref Range  Prothrombin Time 12.1 9.6 - 12.3 seconds  INR 1.10 0.86 - 1.12 Magnesium  Collection Time: 09/16/22  2:10 PM Result Value Ref Range  Magnesium 2.0 1.7 - 2.4 mg/dL Hepatic function panel  Collection Time: 09/16/22  2:10 PM Result Value Ref Range  Total Bilirubin 0.7 <=1.2 mg/dL  Bilirubin, Direct <1.6 <=0.3 mg/dL  Alkaline Phosphatase 45 9 - 122 U/L  Alanine Aminotransferase (ALT) 12 10 - 35 U/L  Aspartate Aminotransferase (AST) 22 10 - 35 U/L  AST/ALT Ratio 1.8 Reference Range Not Established  Total Protein 7.9 6.6 - 8.7 g/dL  Albumin 5.1 (H) 3.6 - 4.9 g/dL  Globulin 2.8 2.3 - 3.5 g/dL  A/G Ratio 1.8 1.0 - 2.2 Lipase  Collection Time: 09/16/22  2:10 PM Result Value Ref Range  Lipase 44 11 - 55 U/L C-reactive protein (CRP)  Collection Time: 09/16/22  2:10 PM Result Value Ref Range  CRP, High Sensitivity <0.2 See comment mg/L Blood Bank extra specimen  Collection Time: 09/16/22  2:10 PM Result Value Ref Range  Hold BB RECVD  Basic metabolic panel  Collection Time: 09/16/22  2:10 PM Result Value Ref Range  Sodium 138 136 - 144 mmol/L  Potassium 4.1 3.3 - 5.3 mmol/L  Chloride 104 98 - 107 mmol/L  CO2 22 20 - 30 mmol/L  Anion Gap 12 7 - 17  Glucose 89 70 - 100 mg/dL  BUN 11 6 - 20 mg/dL  Creatinine 1.09 6.04 - 1.30 mg/dL  Calcium 9.9 8.8 - 54.0 mg/dL  BUN/Creatinine Ratio 98.1 8.0 - 23.0  eGFR (Creatinine) >60 >=60 mL/min/1.69m2 CBC auto differential  Collection Time: 09/16/22  2:10 PM Result Value Ref Range  WBC 5.3 4.0 - 11.0 x1000/?L  RBC 4.62 4.00 - 6.00 M/?L  Hemoglobin 13.1 11.7 - 15.5 g/dL  Hematocrit 19.14 78.29 - 45.00 %  MCV 83.1 80.0 - 100.0 fL  MCH 28.4 27.0 - 33.0 pg  MCHC 34.1 31.0 -  36.0 g/dL  RDW-CV 16.1 09.6 - 04.5 %  Platelets 199 150 - 420 x1000/?L  MPV 11.3 8.0 - 12.0 fL  Neutrophils 54.9 39.0 - 72.0 %  Lymphocytes 38.8 17.0 - 50.0 %  Monocytes 4.0 4.0 - 12.0 %  Eosinophils 1.5 0.0 - 5.0 %  Basophil 0.6 0.0 - 1.4 %  Immature Granulocytes 0.2 0.0 - 1.0 %  nRBC 0.0 0.0 - 1.0 %  ANC(Abs Neutrophil Count) 2.90 2.00 - 7.60 x 1000/?L  Absolute Lymphocyte Count 2.05 0.60 - 3.70 x 1000/?L  Monocyte Absolute Count 0.21 0.00 - 1.00 x 1000/?L  Eosinophil Absolute Count 0.08 0.00 - 1.00 x 1000/?L  Basophil Absolute Count 0.03 0.00 - 1.00 x 1000/?L  Absolute Immature Granulocyte Count 0.01 0.00 - 0.30 x 1000/?L  Absolute nRBC 0.00 0.00 - 1.00 x 1000/?L TSH w/reflex to FT4  Collection Time: 09/16/22  2:10 PM Result Value Ref Range  Thyroid Stimulating Hormone 2.020 See Comment ?IU/mL HIV-1/HIV-2 antibody/antigen screen w/reflex  Collection Time: 09/16/22  2:10 PM Result Value Ref Range  HIV 1 and 2 Antibody/Antigen Screen Negative Negative Phosphorus  Collection Time: 09/16/22  2:10 PM Result Value Ref Range  Phosphorus 3.1 2.2 - 4.5 mg/dL UA reflex to culture  Collection Time: 09/16/22  3:07 PM  Specimen: Urine Result Value Ref Range  Reflex Urine Culture See Comment  Urinalysis with culture reflex     (BH LMW YH)  Collection Time: 09/16/22  3:07 PM  Specimen: Urine Result Value Ref Range  Clarity, UA Clear Clear  Color, UA Colorless Yellow, Colorless  Specific Gravity, UA 1.007 1.005 - 1.030  pH, UA 6.5 5.5 - 7.5  Protein, UA Negative Negative, Trace  Glucose, UA Negative Negative  Ketones, UA Negative Negative  Blood, UA Negative Negative  Bilirubin, UA Negative Negative  Leukocytes, UA Negative Negative  Nitrite, UA Negative Negative  Urobilinogen, UA <2.0 <=2.0 mg/dL POCT urine pregnancy  Collection Time: 09/16/22  3:09 PM Result Value Ref Range  Preg Test, Ur, POC Negative Negative  Line in Control Window? (+ Control) Yes   Background Clear? (- Control) Yes   Pregnancy Kit Lot Number [id number]   Expiration Date 9,[id number]  Calprotectin, stool  Collection Time: 09/16/22  3:10 PM Result Value Ref Range  Calprotectin, Stool 44 See Comment ug/g Diagnostics:I have reviewed the diagnostic imaging report.[place] Right Upper Quadrant [[id number]] Collected: 09/16/22 1604 Order Status: Completed Updated: 09/16/22 1611 Narrative:   RIGHT UPPER QUADRANT ULTRASOUND 3/25/2024HISTORY: Upper abdominal painCOMPARISON: NoneFINDINGS:Liver is negative for demonstrable focal mass. No intrahepatic biliary ductal dilatation is identified. Main portal vein is patent with hepatopedal flow.Common bile duct measures 0.3 cm.Gallbladder is negative for cholelithiasis, wall thickening, or pericholecystic fluid. Sonographic Eulah Pont sign is negative.Visualized portions of the head and body of the pancreas are unremarkable. Remainder of the pancreas is obscured by bowel gas.Right kidney measures 11.3 cm and is negative for hydronephrosis, focal mass, or nephrolithiasis.Proximal aorta and upper IVC are unremarkable. Impression:   Etiology for patient's abdominal pain not identified by this exam. Findings as described above.Reported and signed by: Karna Christmas, MD [employer] [facility] ECG/Tele Events: I have reviewed the ECG.NSR, HR 82, no ischemic changesAssessment / Plan: 27 y.o. F w/ h/o depression / anxiety who p/w epigastric pain and weight loss. Has had several months on epigastric discomfort, chronic nonbloody diarrhea (though reportedly also w/ recent dark stools), nausea, loss of appetite and difficulty tolerating PO, hair loss, and significant weight loss. She is down to 91  lbs (BMI 15.6) so is severally malnourished. Could possibly be d/t a malabsorption syndrome. Of note, celiac disease can be associated with hair loss in addition to chronic diarrhea as well as her other symptoms so will test for this. Differential also includes IBD, especially given her family history of UC and Crohn disease, though her normal CRP and stool calprotectin suggest against this. The differential is broad and includes malignancy, though have lower suspicion for this. May need EGD to further evaluate.# Weight loss, N/V, epigastric pain:- GI made aware of the pt by the ED but should formally be consulted in the AM. Appreciate GI recs.- Will keep NPO for now pending GI eval in case endoscopy is needed.- CRP normal- Stool calprotectin normal- UA negative- U Preg negative- HIV negative. Recent STD panel testing reportedly negative.- LFTs and lipase normal- TSH normal- Checking tissue transglutimase IgA (along w/ total IgA) to assess for celiac disease. If positive, would need biopsy during EGD to confirm.- [place] RUQ unremarkable.- [place] A/P w/ IV done 04/10/22 and TVUS done 07/18/22 both negative. Will hold off on further imaging for now, but could consider repeat [place]; will defer to GI.- Will continue megace.- Started on trial PPI per GI recs. Ordered for protonix 40mg  PO daily; if unable to tolerate PO, should change to IV.- Started mVFs w/ NS @ 75cc/h for now.- Continue zofran IV prn N/V. Pt reports that did not get adequate relief w/ zofran and requesting alternative and so also ordered reglan IV prn- Epigastric pain not controlled w/ oxycodone and toradol that were given by the ED. Will avoid further NSAIDs for now given report of dark stools and unclear etiology at this time to her epigastric pain (e.g. could be gastritis or PUD so would want to avoid NSAIDs). Ordered for morphine 2mg  SC Q4h prn; wean as tolerated. - Should see nutrition consult prior to discharge given severe malnutrition.# Alopecia:- Continue finasteride and spironolactone.# Anxiety / depression:- Continue fluxetine and clonzapam prn.# DVT PPx : Lovenox SC (hold prior to possible endoscopy)# MED REC: Above list per pt report and EMR# Code status: FULL# Notifications: POC was d/w pt and her nurse. Her PCP is in [place] (Dr. Olena Leatherwood).Electronically Signed:Aristidis Talerico FDayton Bailiff, MDHospitalist AttendingMHB: [phone] 09/16/2022, 11:43 PM

## 2022-09-17 NOTE — Plan of Care
Plan of Care Overview/ Patient Status    0700-1900PT is A/O x 4, calm and cooperative. Soft BP, otherwise, VSS RA. PT denies any SOB, HA, CP, or dizziness at this time. C/O N/V - Reglan/ Zofran in place. C/O abdominal pain - PRN morphine syringe 2 mg Q4 - given with + effect. NPO - advanced to Clear liquid diet - poor PO intake. R 20G PIV - C/D/I - patent - running NS at 72ml/hr. Continent B/B - voiding spontaneously - up to toilet, Last BM 3/25 - C/O diarrhea OP, no C/O this shift. Skin intact. Meds taken, as ordered, whole with water. IND in/ OOB. Safety maintained, call bell within reach. Bed alarm refused - steady gait. For further details, please see eMAR and flowsheets.  AddendumC/O HA - PRN Tylenol requested and given with + effectIVF [id number] Bowel Prep for Colonoscopy/Flex Sig - polyethylene glycol (GoLYTELY) solution 2,000 mL Ordered

## 2022-09-17 NOTE — Other
[facility] Gastroenterology Consult NoteReason for Consult: Abdominal pain, diarrhea, weight lossEvaluation requested by: Attending Provider: Marlon Pel, MD [phone] History: Heidi Ortiz is a 27 y/o F with PMHx of depression anxiety presenting with 8 months of epigastric pain, loose stool, and weight loss. Patient reports that over the past 8 months, she has been experiencing progressive weight loss, epigastric pain, and loose stool.  Prior to this, she had no issues from a GI standpoint.  Epigastric pain reports some burning can be mainly associated not always occasionally has improved eating, then symptoms recur few hours postprandially.  No change in pain with bowel movements.  Does note she had problems with constipation to current symptom presentation.  However, for past 8 months has been having 3-5 loose brown bowel movements.  Denies any hematochezia or melena.  No pain with bowel movements.  Denies any incontinence, urgency, nighttime awakenings.  Does note 20-25 lb weight loss over this 8 month period of time. Patient also reports that prior to 8 months, she had been drinking alcohol bit more heavily marijuana use, both of which she has stopped. She has never seen an outpatient gastroenterologist and has never had an EGD or colonoscopy before.Reports that there is a family history of inflammatory bowel disease in her maternal grandmother (ulcerative colitis) and maternal aunt (Crohn's disease).  Patient works as a Tour manager; is from [place] but will be in [place] until early June.  Review of Systems: A 10 point review of systems was performed and is negative except pertinent findings in HPI.Review of Medical/Family/Surgical/Social History/Medications: Past Medical History: Diagnosis Date  Anxiety   Depression   History reviewed. No pertinent family history. Past Surgical History: Procedure Laterality Date  BREAST ENHANCEMENT SURGERY    CLEFT PALATE REPAIR    Social History Tobacco Use  Smoking status: Never  Outpatient Medications:No current facility-administered medications on file prior to encounter. Current Outpatient Medications on File Prior to Encounter Medication Sig Dispense Refill  clonazePAM (KLONOPIN) 1 mg tablet Take 1 tablet (1 mg total) by mouth 3 (three) times daily as needed.    finasteride (PROPECIA) 1 mg tablet Take 1 tablet (1 mg total) by mouth daily.    FLUoxetine (PROZAC) 10 mg capsule Take 1 capsule (10 mg total) by mouth daily.    megestroL (MEGACE) 20 mg tablet Take 1 tablet (20 mg total) by mouth daily.    spironolactone (ALDACTONE) 25 mg tablet Take 1 tablet (25 mg total) by mouth daily.   Objective: Vitals:Temp:  [97 ?F (36.1 ?C)-98.5 ?F (36.9 ?C)] 98.3 ?F (36.8 ?C)Pulse:  [61-96] 61Resp:  [16-20] 16BP: (93-107)/(55-70) 93/55SpO2:  [98 %-100 %] 98 %Device (Oxygen Therapy): room airPhysical Exam:General: Appears stated age, in NADHEENT: MMM, sclera anicteric Pulm: non-labored breathing on RAAbd: soft, non distended, mild diffuse tenderness without guarding and rebound tendernessSkin: no rashes, lesionsNeuro: Moving all extremities, no focal deficits, no asterixisPsych: Appropriate thought content and speech Medications:SCHEDULED:Current Facility-Administered Medications Medication Dose Route Frequency Provider Last Rate Last Admin  [Held by provider] enoxaparin (LOVENOX) syringe 40 mg  40 mg Subcutaneous Daily Desouza, Wilber Bihari., MD      FLUoxetine (PROzac) capsule 10 mg  10 mg Oral Daily Carollee Sires., MD   10 mg at 09/17/22 2956  megestroL (MEGACE) tablet 20 mg  20 mg Oral Daily Carollee Sires., MD   20 mg at 09/17/22 0922  pantoprazole (PROTONIX) EC tablet 40 mg  40 mg Oral Daily Carollee Sires., MD  40 mg at 09/17/22 0921  polyethylene glycol (GoLYTELY) solution 2,000 mL  2,000 mL Oral Q9H Rosemary Holms, MD      sodium chloride 0.9 % flush 3 mL  3 mL IV Push Q8H Carollee Sires., MD   3 mL at 09/17/22 1610  spironolactone (ALDACTONE) tablet 25 mg  25 mg Oral Daily Carollee Sires., MD   25 mg at 09/17/22 9604 VWU:[id number], clonazePAM, metoclopramide (REGLAN) IV Push OR IVPB, morphine (ADULT), ondansetron (ZOFRAN) IV Push, sodium chlorideLabs:Recent Labs Lab 03/25/241410 03/26/240527 WBC 5.3 5.2 HGB 13.1 10.9* HCT 38.40 32.20* PLT 199 173  Recent Labs Lab 03/25/241410 03/26/240527 NEUTROPHILS 54.9 28.3*  Recent Labs Lab 03/25/241410 03/26/240527 NA 138 137 K 4.1 3.5 CL 104 108* CO2 22 21 BUN 11 10 CREATININE 0.65 0.76 GLU 89 76 ANIONGAP 12 8  Recent Labs Lab 03/25/241410 03/26/240527 CALCIUM 9.9 8.7* MG 2.0 2.0 PHOS 3.1  --   Recent Labs Lab 03/25/241410 ALT 12 AST 22 ALKPHOS 45 BILITOT 0.7 BILIDIR <0.2 PROT 7.9 ALBUMIN 5.1*  Recent Labs Lab 03/25/241409 LABPROT 12.1 INR 1.10  No results found for: IRON, TIBC, FERRITIN Imaging:US Right Upper Quadrant (09/16/2022 4:05 PM) [place] Abdomen Pelvis w IV Contrast (04/10/2022 6:32 PM) Procedural:No prior EGD/colonoscopyImpression & Recommendations: Heidi Ortiz is a 27 y/o F with PMHx of depression anxiety presenting with 8 months of epigastric pain, loose stool, and weight loss. Currently, differential for diarrhea is wide including malabsorptive conditions, celiac, microscopic colitis, IBD. With epigastric pain, can also consider PUD, esophagitis as additional etiologies. Would send micronutrient an malabsorption workup as below. Although there is no emergent reason for endoscopic evaluation, as patient is currently admitted for these symptoms including significant weight loss, will plan for EGD/colonoscopy tomorrow 09/18/22.- F/u celiac serologies- please send vitamin A, carotene, vitamin D, vitamin E, INR, zinc, and copper levels, vitamin B12, folate, iron panel including ferritin, and pre-albumin. - Please send stool for pancreatic elastase and fecal fat, alpha 1 antitrypsin to evaluate for malabsorptive states- Clear liquid diet today- Keep NPO at midnight for EGD/Colonoscopy 09/18/22- split GoLYTELY bowel prep this evening (has been ordered by GI team)- Will arrange for outpatient GI f/u**This patient is part of a clinical trial studying bowel preparation. Please DO NOT discontinue bowel preparation orders placed by the GI team.**We will continue to follow.Case discussed with Dr. Kizzie Bane, final attending attestation to follow. Please do not hesitate to contact our team with any questions or concerns.Patient's Primary Gastroenterologist: N/AThis consult note has been routed to patient's primary gastroenterologist: Patient does not have a primary gastroenterologistSwathi Rito Ehrlich, MDYale Digestive DiseasesClinical Fellow, PGY-4Best Contact: Mobile [employer] [phone] after 5pm and weekends, please contact on-call GI fellow as listed on Amion

## 2022-09-17 NOTE — Plan of Care
Problem: Adult Inpatient Plan of CareGoal: Readiness for Transition of CareOutcome: Interventions implemented as appropriateCase Management Screening  Flowsheet Row Most Recent Value Case Management Screening: Chart review completed. If YES to any question below then proceed to CM Eval/Plan  Is there a change in their cognitive function No Do you anticipate that the pt will have any discharge needs requiring CM intervention? No Has there been a readmission within the last 30 days No Were there services prior to admission ( Examples: Assisted Living, HD, Homecare, Extended Care Facility, Methadone, SNF, Outpatient Infusion Center) No Negative/Positive Screen Negative Screening: Case Management department will follow patient's progress and discuss plan of care with treatment team. Case Manager Attestation  I have reviewed the medical record and completed the above screen. CM staff will follow patient's progress and discuss the plan of care with the Treatment Team. Yes    Plan of Care Overview/ Patient Status    27 y.o. F w/ h/o depression / anxiety who p/w epigastric pain and weight loss.  She endorses a > 20 lb weight loss over the last few months, most recently 5 lb loss in the last week from 96 to 91 lbs. She has had low appetite and decreased PO intake over the past several months along with nausea. No recent vomiting at home but did have episode of NB NB emesis in the ED.  She was started on megestrol as an appetite stimulant but despite this has continued to lose weight.  She also has had chronic, nonbloody diarrhea for the past several months. She reports that this part week she had some dark stools but denies any BRBPR. She also c/o epigastric discomfort that has been worsening. Patient is a negative screen, do not anticipate any dc needs.  Patient presents with abdominal pain, N&V.  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  Patient to have GI workup. Patient to arrange own transportation at discharge.Care Manager will continue to follow with the Team.Ajiah Mcglinn MSN RN CM

## 2022-09-17 NOTE — ED Notes
Chief Complaint Patient presents with  Abdominal Pain   Epigastric pain for about a month, having black stools Friday and Saturday, severe nausea, and vomiting with any food intake, about 20lb weight loss over the past few months and about 10lb weight loss over a couple of weeks, also having hair loss,  [id number] care. AAOx4, VSS on RA. Presented to ED for having epigastric pain for about a month. Started having black stools Friday and Saturday. Associated with severe nausea and vomiting with intake of food. 20 Lbs weight loss over few months. Patient stated recently she weighed herself and lost more weight and having some hair loss as well. No resp/cardiac distress noted. OOB Independently. C/o pain to abdomen. Medications given with positive effect per MAR. Lungs:CTA. S1 and S2 sounds heard throughout the precordium. Bilateral radial and dorsalis pedis pulses 2+. IV patent and maintained. Positive bowel sounds present. Voids spontaneously. Safety precautions/plan maintained. Frequent Hourly rounding provided. Call bell within reach, bedside table within reach, and bed in the lowest position. Patient transferred to floor for continuation of care. Patient left with Transport via Wheelchair @ [phone]. Patient left in no apparent distress. Patient transferred to NP1.

## 2022-09-18 ENCOUNTER — Inpatient Hospital Stay: Admit: 2022-09-18 | Payer: PRIVATE HEALTH INSURANCE | Attending: Anesthesiology

## 2022-09-18 DIAGNOSIS — K2270 Barrett's esophagus without dysplasia: Secondary | ICD-10-CM

## 2022-09-18 DIAGNOSIS — F419 Anxiety disorder, unspecified: Secondary | ICD-10-CM

## 2022-09-18 DIAGNOSIS — Z79899 Other long term (current) drug therapy: Secondary | ICD-10-CM

## 2022-09-18 DIAGNOSIS — E43 Unspecified severe protein-calorie malnutrition: Secondary | ICD-10-CM

## 2022-09-18 DIAGNOSIS — R64 Cachexia: Secondary | ICD-10-CM

## 2022-09-18 DIAGNOSIS — Z87730 Personal history of (corrected) cleft lip and palate: Secondary | ICD-10-CM

## 2022-09-18 DIAGNOSIS — Z885 Allergy status to narcotic agent status: Secondary | ICD-10-CM

## 2022-09-18 DIAGNOSIS — K5900 Constipation, unspecified: Secondary | ICD-10-CM

## 2022-09-18 DIAGNOSIS — R109 Unspecified abdominal pain: Secondary | ICD-10-CM

## 2022-09-18 DIAGNOSIS — Z681 Body mass index (BMI) 19 or less, adult: Secondary | ICD-10-CM

## 2022-09-18 DIAGNOSIS — K259 Gastric ulcer, unspecified as acute or chronic, without hemorrhage or perforation: Secondary | ICD-10-CM

## 2022-09-18 DIAGNOSIS — F32A Depression, unspecified: Secondary | ICD-10-CM

## 2022-09-18 LAB — FERRITIN: BKR FERRITIN: 73 ng/mL (ref 13–150)

## 2022-09-18 LAB — CBC WITHOUT DIFFERENTIAL
BKR WAM ANALYZER ANC: 2.49 x 1000/??L (ref 2.00–7.60)
BKR WAM HEMATOCRIT (2 DEC): 40.5 % (ref 35.00–45.00)
BKR WAM HEMOGLOBIN: 13.2 g/dL (ref 11.7–15.5)
BKR WAM MCH (PG): 28 pg (ref <=4.3)
BKR WAM MCHC: 32.6 g/dL (ref 31.0–36.0)
BKR WAM MCV: 85.8 fL (ref 5.7–19.9)
BKR WAM MPV: 11.2 fL (ref 8.0–12.0)
BKR WAM RDW-CV: 12.2 % (ref 11.0–15.0)
BKR WAM RED BLOOD CELL COUNT.: 4.72 M/ÂµL (ref 4.00–6.00)
BKR WAM WHITE BLOOD CELL COUNT: 7.1 x1000/??L (ref 4.0–11.0)

## 2022-09-18 LAB — BASIC METABOLIC PANEL
BKR ANION GAP: 13 (ref 7–17)
BKR BLOOD UREA NITROGEN: 10 mg/dL (ref 6–20)
BKR BUN / CREAT RATIO: 11.9 (ref 8.0–23.0)
BKR CALCIUM: 9.5 mg/dL — ABNORMAL HIGH (ref 8.8–10.2)
BKR CHLORIDE: 108 mmol/L — ABNORMAL HIGH (ref 98–107)
BKR CO2: 19 mmol/L — ABNORMAL LOW (ref 20–30)
BKR CREATININE: 0.84 mg/dL (ref 0.40–1.30)
BKR EGFR, CREATININE (CKD-EPI 2021): >60 mL/min/{1.73_m2} (ref >=60)
BKR GLUCOSE: 79 mg/dL (ref 70–100)
BKR POTASSIUM: 4.1 mmol/L (ref 3.3–5.3)
BKR SODIUM: 140 mmol/L (ref 136–144)

## 2022-09-18 LAB — FOLATE: BKR FOLATE: 15.2 ng/mL

## 2022-09-18 LAB — IRON AND TIBC: BKR IRON: 139 ug/dL (ref 37–145)

## 2022-09-18 LAB — FAT, STOOL, RANDOM, SEMI-QUANTITATIVE     (BH GH LMW YH): BKR FECAL FAT, SEMI-QUANTITATIVE: NEGATIVE

## 2022-09-18 LAB — PROTIME AND INR
BKR INR: 1.07 g/dL (ref 60–130)
BKR PROTHROMBIN TIME: 11.8 s (ref 9.6–12.3)

## 2022-09-18 LAB — PREALBUMIN: BKR PREALBUMIN: 21.4 mg/dL (ref 20.0–40.0)

## 2022-09-18 LAB — VITAMIN D 25 (VITAMIN D STATUS)(MINMETABLAB)(YH): VITAMIN D 25-HYDROXY: 26 ng/mL (ref 20–50)

## 2022-09-18 LAB — VITAMIN B12
BKR VITAMIN B12: 556 pg/mL (ref 232–1245)
BKR WAM PLATELETS: 556 pg/mL (ref 232–1245)

## 2022-09-18 MED ORDER — PHENYLEPHRINE 1 MG/10 ML (100 MCG/ML) IN 0.9 % SOD.CHLORIDE IV SYRINGE
110100 mg/0 mL (00 mcg/mL) | INTRAVENOUS | Status: DC | PRN
Start: 2022-09-18 — End: 2022-09-18
  Administered 2022-09-18 (×5): 1 mg/0 mL (00 mcg/mL) via INTRAVENOUS

## 2022-09-18 MED ORDER — ONDANSETRON HCL (PF) 4 MG/2 ML INJECTION SOLUTION
42 mg/2 mL | Status: CP
Start: 2022-09-18 — End: ?

## 2022-09-18 MED ORDER — ONDANSETRON HCL (PF) 4 MG/2 ML INJECTION SOLUTION
42 mg/2 mL | Freq: Once | INTRAVENOUS | Status: CP
Start: 2022-09-18 — End: ?
  Administered 2022-09-18: 14:00:00 4 mL via INTRAVENOUS

## 2022-09-18 MED ORDER — LACTATED RINGERS INTRAVENOUS SOLUTION
INTRAVENOUS | Status: DC | PRN
Start: 2022-09-18 — End: 2022-09-18
  Administered 2022-09-18: 12:00:00 via INTRAVENOUS

## 2022-09-18 MED ORDER — PANTOPRAZOLE 40 MG TABLET,DELAYED RELEASE
40 mg | ORAL_TABLET | Freq: Two times a day (BID) | ORAL | 1 refills | Status: AC
Start: 2022-09-18 — End: ?

## 2022-09-18 MED ORDER — FENTANYL (PF) 50 MCG/ML INJECTION SOLUTION
50 mcg/mL | INTRAVENOUS | Status: DC | PRN
Start: 2022-09-18 — End: 2022-09-18
  Administered 2022-09-18 (×4): 50 mcg/mL via INTRAVENOUS

## 2022-09-18 MED ORDER — PROPOFOL 10 MG/ML INTRAVENOUS EMULSION
10 mg/mL | INTRAVENOUS | Status: DC | PRN
Start: 2022-09-18 — End: 2022-09-18
  Administered 2022-09-18 (×2): 10 mg/mL via INTRAVENOUS

## 2022-09-18 MED ORDER — LIDOCAINE (PF) 20 MG/ML (2 %) INJECTION SOLUTION
202 mg/mL (2 %) | INTRAVENOUS | Status: DC | PRN
Start: 2022-09-18 — End: 2022-09-18
  Administered 2022-09-18: 12:00:00 20 mg/mL (2 %) via INTRAVENOUS

## 2022-09-18 MED ORDER — PROPOFOL 10 MG/ML INTRAVENOUS EMULSION
10 mg/mL | Status: DC | PRN
Start: 2022-09-18 — End: 2022-09-18
  Administered 2022-09-18: 12:00:00 10 mL/h

## 2022-09-18 MED ORDER — METOCLOPRAMIDE 10 MG TABLET
10 mg | ORAL_TABLET | Freq: Four times a day (QID) | ORAL | 1 refills | Status: AC | PRN
Start: 2022-09-18 — End: ?

## 2022-09-18 MED ORDER — FENTANYL (PF) 50 MCG/ML INJECTION SOLUTION
50 mcg/mL | Status: CP
Start: 2022-09-18 — End: ?

## 2022-09-18 MED ORDER — EPHEDRINE 25 MG/5 ML IN 0.9% SODIUM CHLORIDE (WRAPPED ERX)
2555 mg/5 mL (5 mg/mL) | INTRAVENOUS | Status: DC | PRN
Start: 2022-09-18 — End: 2022-09-18
  Administered 2022-09-18: 13:00:00 25 mg/5 mL (5 mg/mL) via INTRAVENOUS

## 2022-09-18 NOTE — Progress Notes
[facility] Diseases Gastroenterology Progress NoteAttending Provider: Marlon Pel, MD [phone] History: - VSS, labs: stable, folate wnl, B12 wnl, ferritin 73- Patient feeling well this morning. Completed prep, stools clear and yellow. Objective: Vitals:Temp:  [97.7 ?F (36.5 ?C)-98.5 ?F (36.9 ?C)] 97.7 ?F (36.5 ?C)Pulse:  [61-68] 68Resp:  [16] 16BP: (93-99)/(55-60) 93/58SpO2:  [98 %-100 %] 99 %Device (Oxygen Therapy): room airPhysical Exam:GENERAL: Awake, cooperative, in no acute distress. Well nourished.EYES: No scleral icterus, no conjunctival pallorENT: Moist mucous membranes, no oropharyngeal lesions, neck symmetric and suppleRESPIRATORY: No respiratory distress on RA GASTROINTESTINAL: Soft, nondistended, TTP in the RUQ, epigastric and RLQSKIN: Warm and dry. No jaundice, no skin lesions on visualized skin NEURO: Alert and Oriented x 3, no focal deficitsMedications:SCHEDULED:Current Facility-Administered Medications Medication Dose Route Frequency Provider Last Rate Last Admin  [Held by provider] enoxaparin (LOVENOX) syringe 40 mg  40 mg Subcutaneous Daily Desouza, Wilber Bihari., MD      FLUoxetine (PROzac) capsule 10 mg  10 mg Oral Daily Carollee Sires., MD   10 mg at 09/17/22 4782  megestroL (MEGACE) tablet 20 mg  20 mg Oral Daily Carollee Sires., MD   20 mg at 09/17/22 9562  pantoprazole (PROTONIX) EC tablet 40 mg  40 mg Oral Daily Carollee Sires., MD   40 mg at 09/17/22 0921  sodium chloride 0.9 % flush 3 mL  3 mL IV Push Q8H Carollee Sires., MD   3 mL at 09/17/22 1308  spironolactone (ALDACTONE) tablet 25 mg  25 mg Oral Daily Carollee Sires., MD   25 mg at 09/17/22 6578 ION:[id number], clonazePAM, metoclopramide (REGLAN) IV Push OR IVPB, morphine (ADULT), ondansetron (ZOFRAN) IV Push, sodium chlorideLabs:Recent Labs Lab 03/25/241410 03/26/240527 03/27/240443 WBC 5.3 5.2 7.1 HGB 13.1 10.9* 13.2 HCT 38.40 32.20* 40.50 PLT 199 173 183  Recent Labs Lab 03/25/241410 03/26/240527 NEUTROPHILS 54.9 28.3*  Recent Labs Lab 03/25/241410 03/26/240527 03/27/240443 NA 138 137 140 K 4.1 3.5 4.1 CL 104 108* 108* CO2 22 21 19* BUN 11 10 10  CREATININE 0.65 0.76 0.84 GLU 89 76 79 ANIONGAP 12 8 13   Recent Labs Lab 03/25/241410 03/26/240527 03/27/240443 CALCIUM 9.9 8.7* 9.5 MG 2.0 2.0  --  PHOS 3.1  --   --   Recent Labs Lab 03/25/241410 ALT 12 AST 22 ALKPHOS 45 BILITOT 0.7 BILIDIR <0.2 PROT 7.9 ALBUMIN 5.1*  Recent Labs Lab 03/25/241409 03/27/240443 LABPROT 12.1 11.8 INR 1.10 1.07  Lab Results Component Value Date  IRON 139 09/18/2022  TIBC  09/18/2022    Comment:    Unable to calculate TIBC. Sample Hemolyzed.  FERRITIN 73 09/18/2022  Imaging:ReviewedProcedural:None priorOImpression & Recommendations Tinley Chrest is a 27 y/o F with PMHx of depression anxiety presenting with 8 months of epigastric pain, loose stool, and weight loss. Differential for diarrhea is wide including malabsorptive conditions, celiac, microscopic colitis. Less c/f inflammatory conditions given FCP 44 (09/16/22). With epigastric pain, can also consider PUD, esophagitis as additional etiologies. Would send micronutrient an malabsorption workup as below. Although there is no emergent reason for endoscopic evaluation, as patient is currently admitted for these symptoms including significant weight loss.- NPO for EGD and colonoscopy today- F/u celiac serologies and micronutrient work up We will continue to follow.Case discussed with Dr. Domingo Pulse, final attending attestation to follow. Please do not hesitate to contact our team with any questions or Cloria Spring, MD [facility] Digestive DiseasesBest Contact: [employer] [phone] after 5pm and weekends, please contact on-call GI fellow as listed on Amion Attending  StatementI have seen/evaluated the patient and agree with the history, physical exam and plan as documented by the Fellow The patient is planned for a bidirectional endoscopy today. We discussed the risks, benefits and alternatives to the procedure. Discussed risks included but not limited to risks of sedation, perforation requiring surgery, bleeding, local irritation and missed lesions.  We discussed that a rectal exam may be performed if required under sedation as part of the exam.Electronically Signed:Pamella Samons Ua Cotina Freedman, MD3/27/20247:40 AM

## 2022-09-18 NOTE — Plan of Care
Plan of Care Overview/ Patient Status    0700-1900PT is A/O x 4, calm and cooperative. Soft BP, otherwise, VSS RA. PT denies any SOB, HA, CP, or dizziness at this time. C/O N/V - Reglan 10mg  IV given with good effect. C/O abdominal pain - no med intervention needed at this time. NPO - EGD/ colonoscopy, decreased PO intake recently - 20 lb weight loss, PO encouraged throughout shift, supplemental drinks also encouraged.  R 20G PIV - C/D/I - patent - IVF DCed. Continent B/B - voiding spontaneously - up to toilet, Last BM 3/27 - bowel prep over night completed. Skin intact. Meds taken, as ordered, whole with water. IND in/ OOB. Safety maintained, call bell within reach. Bed alarm refused - steady gait. For further details, please see eMAR and flowsheets.  Addendum0730PT off unit to EGD/ colonoscopy1030Back to unitVSSPT tolerated procedure well. No S/S distress. Regular diet orderedMeds given1200Bloody stool visualized x 2 - PA Taylor advised1300Dc ORDER PLACED. PIV DCed. Personal possessions collected and ith PT. AVS/ DC instructions discussed with PT. PT stated an understanding of these instructions. Heidi Ortiz will be discharged via  accompanied by car Alone.  Verbalized understanding of discharge instructionsand recommended follow up care as per the after visit summary.  Written discharge instructions provided. Denies any further questions. Vital signs    Vitals:  09/18/22 0935 09/18/22 0945 09/18/22 0950 09/18/22 1007 BP: (!) 100/51 (!) 99/59 (!) 99/59 103/66 Pulse: (!) 139 64 (!) 93 64 Resp: 17   16 Temp:    97.7 ?F (36.5 ?C) TempSrc:    Oral SpO2: 100% (!) 91% 100% 100% Weight:     Height:     Patient confirmed all belongings returned. Belongings charted in last 7 days: Patient Valuables   Patient Valuables Flowsheet                    PATIENT VALUABLE(S)       Cell phone disposition At bedside/locker/closet 09/18/22 0753  Jewelry disposition At bedside/locker/closet 09/18/22 0753   Purse Disposition At bedside/locker/closet 09/17/22 0129  Clothing Disposition At bedside/locker/closet 09/18/22 0753   Other disposition At bedside/locker/closet  grey colored bag 09/17/22 0129

## 2022-09-18 NOTE — Anesthesia Post-Procedure Evaluation
Anesthesia Post-op NotePatient: Heidi EllisProcedure(s):  Procedure(s) (LRB):UPPER GI ENDOSCOPY; DX, W/WO SPECIMEN COLLECTION, BRUSHING/WASHING (SEP PROC) (N/A)COLONOSCOPY, FLEXIBLE; DX, W/WO SPECIMENS/COLON DECOMP (SEP PROC) (N/A) Last Vitals:  I have reviewed the post-operative vital signs as noted in the Epic chart.POSTOP EVALUATION:      Patient Recovery Location:  PACU     Vital Signs Status:  Stable     Patient Participation:  Patient participated     Mental Status:  Awake     Respiratory Status:  Acceptable     Airway Patency:  Patent     Cardiovascular/Hydration Status:  Stable     Pain Management:  Satisfactory to patient     Nausea/Vomiting Status:  Satisfactory to patientThere were no known notable events for this encounter.

## 2022-09-18 NOTE — Other
Post Anesthesia Transfer of Care NotePatient: Heidi EllisProcedure(s) Performed: Procedure(s) (LRB):UPPER GI ENDOSCOPY; DX, W/WO SPECIMEN COLLECTION, BRUSHING/WASHING (SEP PROC) (N/A)COLONOSCOPY, FLEXIBLE; DX, W/WO SPECIMENS/COLON DECOMP (SEP PROC) (N/A)Last Vitals: I have reviewed the post-operative vital signs during the handoff as noted in the Epic chart.POSTOP HANDOFF :      Patient Location:  PACU     Level of Consciousness:  Sedated     VS stable since last recorded intra-op set? Yes       Oxygen source: room airPatient co-morbidities, intra-operative course, intake & output and antibiotics as per Anesthesia record were discussed with the RN.

## 2022-09-18 NOTE — Discharge Summary
[facility] Discharge SummaryPatient Data Patient Name: Heidi Ortiz Age: 27 y.o. DOB: May 19, 1996	 MRN: [id number]	 PCP: No primary care provider on file. Admission Date: 3/25/2024Discharge Date: 3/27/2024Discharge Attending Physician: Donzetta Starch, MDDischarged Condition: goodDisposition: Home  Principal Diagnosis: Abdominal pain, unspecified abdominal locationOther Diagnoses: The primary encounter diagnosis was Abdominal pain, unspecified abdominal location. Diagnoses of Weight loss and Diarrhea, unspecified type were also pertinent to this visit.Issues to be Addressed Post Discharge Follow up with GI -- referral placedHospital Summary Brief Hospital Course: Heidi Ortiz is a 27 y.o. woman with PMH of depression and anxiety who presented on 3/25 with epigastric pain and weight loss. In the ED patient was slightly tachycardic, other VSS. Labs WNL. EKG NSR. [place] RUQ unremarkable. In the ED she received Famotidine, Toradol, Oxycodone and Zofran and was admitted for further care. GI was consulted. CRP and stool calprotectin WNL. Patient was started on Protonix BID. Patient underwent EGD/Colonoscopy on 3/27 which revealed findings c/f short-segment barrett's esophagus and small erosion in stomach. GI recommended Protonix BID for 2 weeks. Outpatient GI referral placed to follow up pathology results and micronutrient and malabsorption labs. Patient tolerated lunch and was stable for discharge home on 09/18/22. Prescribed Reglan and Protonix on discharge. Comorbidities Comorbidities present on admission:Patient has severe malnutrition based on BMI (Calculated): 15.6 and age.   Nutritional support and guidance has been requested. Secondary diagnoses occurring during hospitalization: Pertinent Procedures: Surgical and Procedural Summary this Admission   Past and Present Procedures (09/16/2022 to Today)   Date Procedures Providers Location  09/18/2022 UPPER GI ENDOSCOPY; DX, W/WO SPECIMEN COLLECTION, BRUSHING/WASHING (SEP PROC);COLONOSCOPY, FLEXIBLE; DX, W/WO SPECIMENS/COLON DECOMP (SEP PROC) Domingo Pulse, Zain [facility]    Inpatient Consultants: Active orders:  There are no active orders of the following types: Consult. Discharge Exam VS: Temp:  [97.6 ?F (36.4 ?C)-98.3 ?F (36.8 ?C)] 97.7 ?F (36.5 ?C)Pulse:  [56-148] 64Resp:  [9-17] 16BP: (72-103)/(37-68) 103/66SpO2:  [91 %-100 %] 100 %Device (Oxygen Therapy): room air   Physical Exam: Gen: WNWD, NAD, malnourished HEENT: NCAT, EOMI, anicteric, neck supple, OP clearCV: RRR, no murmursPulm: CTAB, no wheezingAbd: +BS, soft, diffuse tenderness to palpation Ext: No lower ext edema. Palpable pedal pulsesSkin: warm and dryNeuro: Alert and grossly oriented, no overt focal deficitsDiet:  Dietary Orders (From admission, onward)     Start     Ordered  09/18/22 1034  Diet Regular  DIET EFFECTIVE NOW      Question:  Initiate Nutrition Management Protocol (Yes/No?)  Answer:  Yes - Initiate Protocol  09/18/22 1033  09/17/22 1435  Nutrition Supplements  Once      Comments: Important:  When ordering a supplement- please order only what facility provides (see suffix) Question Answer Comment Adult Supplements: Ensure Clear Allyson Sabal [facility])  Supplement Frequency: TID    09/17/22 1434    Nutrition Dx: Malnutrition Diagnosis - Adult: Severe MalnutritionMobility:   Pertinent Diagnostics Labs:Recent Labs Lab 03/27/240443 WBC 7.1 HGB 13.2 HCT 40.50 PLT 183  Recent Labs Lab 03/27/240443 INR 1.07  Recent Labs Lab 03/27/240443 NA 140 K 4.1 CL 108* CO2 19* ANIONGAP 13 BUN 10 CREATININE 0.84 CALCIUM 9.5 Recent Labs Lab 03/25/241410 03/26/240527 MG 2.0 2.0 PHOS 3.1  --   Recent Labs Lab 03/25/241410 PROT 7.9 ALBUMIN 5.1* GLOB 2.8 BILITOT 0.7 AST 22 ALT 12 ALKPHOS 45  Recent Labs Lab 03/25/241410 03/26/240527 03/27/240443 GLU 89 76 79 No results for input(s): HGBA1C in the last 168 hours.  Recent Labs   03/25/241507 CLARITYU Clear  COLORU Colorless SPECGRAV 1.007 PHUR 6.5 PROTEINUA Negative GLUCOSEU Negative KETONESU Negative BLOODU Negative BILIRUBINUR Negative UROBILINOGEN <2.0 LEUKOCYTESUR Negative NITRITE Negative  No results found for: WBC/HPF, RBC/HPF, BACTERIA, UA, EPITHELIAL CELLS, BACTERIA, UA No results found for: Berdine Addison, LOWRESPCULT, Tobe Sos, SUPERFICWDCULab Results Component Value Date  SARSCOV2 Not Detected 08/22/2020 Lab Results Component Value Date  INFLUENZA A PCR EXT Negative 08/22/2020  INFLUENZA B EXT Negative 08/22/2020 Pending Lab Results   Order Current Status  Pathology [facility]) Collected (09/18/22 1610)  Alpha-1-antitrypsin, feces, random In process  Carotene, serum In process  Copper, serum In process  Pancreatic elastase-1 In process  Tissue transglutaminase, IgA In process  Vitamin A In process  Vitamin D 25 (vitamin D status)([facility]) In process  Vitamin E In process  Zinc In process  Imaging:US Right Upper QuadrantResult Date: 3/25/2024Etiology for patient's abdominal pain not identified by this exam. Findings as described above. Reported and signed by: Karna Christmas, MD  [facility] Radiology and Biomedical Imaging  No results found for this visit on 09/16/22.Post-Discharge Information Follow-up Information:[facility] Department of Gastroenterology[address] [facility] [place] [phone] an appointment as soon as possible for a visit in 2 days No future appointments.Discharge Medications: Discharge: Current Discharge Medication List  START taking these medications  Details metoclopramide HCl (REGLAN) 10 mg tablet Take 1 tablet (10 mg total) by mouth every 6 (six) hours as needed for nausea or vomiting.Qty: 120 tablet, Refills: 0Start date: 09/18/2022  pantoprazole (PROTONIX) 40 mg tablet Take 1 tablet (40 mg total) by mouth 2 (two) times daily.Qty: 28 tablet, Refills: 0Start date: 09/18/2022, End date: 10/02/2022   CONTINUE these medications which have NOT CHANGED  Details clonazePAM (KLONOPIN) 1 mg tablet Take 1 tablet (1 mg total) by mouth 3 (three) times daily as needed.  finasteride (PROPECIA) 1 mg tablet Take 1 tablet (1 mg total) by mouth daily.  FLUoxetine (PROZAC) 10 mg capsule Take 1 capsule (10 mg total) by mouth daily.  megestroL (MEGACE) 20 mg tablet Take 1 tablet (20 mg total) by mouth daily.  spironolactone (ALDACTONE) 25 mg tablet Take 1 tablet (25 mg total) by mouth daily.   PMH PSH Past Medical History: Diagnosis Date  Anxiety   Depression   Past Surgical History: Procedure Laterality Date  BREAST ENHANCEMENT SURGERY    CLEFT PALATE REPAIR    Social History Family History Social History Tobacco Use  Smoking status: Never  Smokeless tobacco: Not on file Substance Use Topics  Alcohol use: Not on file  History reviewed. No pertinent family history. Allergies Allergies Allergen Reactions  Dilaudid [Hydromorphone] Rash   Tolerates other opiates including morphine and oxycodone  Total time spent seeing and examining the patient, coordinating care, counseling on discharge instructions and completing discharge summary: > 30 minsElectronically Signed:Demitria Hay Ladona Ridgel, PA-C3/27/202410:48 AMBest Contact Information: M[facility]

## 2022-09-18 NOTE — Plan of Care
Plan of Care Overview/ Patient Status    1900-2330 pt is alert oriented x 4, vss on room air. Soft bp.  Ns at 75, clear npo at 0000 for egd drank half golytly having bms.  Denies sob,cp,ha reports nausea and abd pain. Prn meds per mar.  I in and oob steady. Continent of bowel and bladder. Skin intact piv c/d/I patent. Wctm see emar and flowsheet for additional info.Problem: Adult Inpatient Plan of CareGoal: Plan of Care ReviewOutcome: Interventions implemented as appropriateGoal: Patient-Specific Goal (Individualized)Outcome: Interventions implemented as appropriateGoal: Absence of Hospital-Acquired Illness or InjuryOutcome: Interventions implemented as appropriateGoal: Optimal Comfort and WellbeingOutcome: Interventions implemented as appropriateGoal: Readiness for Transition of CareOutcome: Interventions implemented as appropriate Problem: Fall Injury RiskGoal: Absence of Fall and Fall-Related InjuryOutcome: Interventions implemented as appropriate Problem: Pain AcuteGoal: Optimal Pain Control and FunctionOutcome: Interventions implemented as appropriate Problem: Nausea and VomitingGoal: Nausea and Vomiting ReliefOutcome: Interventions implemented as appropriate

## 2022-09-18 NOTE — Procedures
GI Brief Post-Procedure Note Procedure(s) Performed: Procedure:    UPPER GI ENDOSCOPY; DX, W/WO SPECIMEN COLLECTION, BRUSHING/WASHING (SEP PROC)CPT(R) Code:  09811 - PR ESOPHAGOGASTRODUODENOSCOPY TRANSORAL DIAGNOSTICProcedure:    COLONOSCOPY, FLEXIBLE; DX, W/WO SPECIMENS/COLON DECOMP (SEP PROC)CPT(R) Code:  91478 - PR COLONOSCOPY FLX DX W/COLLJ SPEC WHEN PFRMDSurgeon(s) and Role:   * Tarance Balan, Harlow Ohms, MD - PrimarySummary of endoscopic findings:- The esophagus and gastroesophageal junction were examined with white light and narrow band imaging (NBI) from a forward view and retroflexed position.  There were esophageal mucosal changes suspicious for short-segment Barrett's esophagus.  These changes involved the mucosa at the upper extent of the gastric folds (41 cm from the incisors) extending to the Z-line (40 cm from the incisors).  One tongue of salmon-colored mucosa was present from 40 to 41 cm.  The maximum longitudinal extent of these esophageal mucosal changes was 1 cm in length.  Mucosa was biopsied with a cold forceps for histology. - The exam of the esophagus was otherwise normal. - A single localized small erosion with no bleeding and no stigmata of recent bleeding was found in the prepyloric region of the stomach.  Biopsies were taken with a cold forceps for histology.  Biopsies were taken with a cold forceps for Helicobacter pylori testing. There was some bleeding from one of the biopsy sites. To prevent bleeding after the biopsy, one hemostatic clip was successfully placed (MR conditional).  Clip manufacturer: [employer].  There was no bleeding at the end of the procedure. - The exam of the stomach was otherwise normal. - The examined duodenum was normal.  Biopsies for histology were taken with a cold forceps for evaluation of celiac disease. - The terminal ileum appeared normal.  Biopsies were taken with a cold forceps for histology. - The colon (entire examined portion) appeared normal.  Biopsies for histology were taken with a cold forceps from the right colon and left colon for evaluation of microscopic colitis. - Retroflexion in the rectum was not performed due to anatomy. * No implants in log *      Prior to Admission Medications Medication Name Sig Taking? Patient Reported   clonazePAM (KLONOPIN) 1 mg tabletLast dose:  -- Last Medication Note: >> Melonie [place] Sep 16, 2022 11:44 PMMedHX Tech(Coretta Hines-Campbell, CPHT): Patient reports 1T HSEntered by Hines-Campbell, Coretta, CPHT Mon Sep 16, 2022 2344 Take 1 tablet (1 mg total) by mouth 3 (three) times daily as needed. Yes Yes   famotidine (PEPCID) 20 mg tabletLast dose:  --  Take 1 tablet (20 mg total) by mouth 2 (two) times daily.       finasteride (PROPECIA) 1 mg tabletLast dose:  --  Take 1 tablet (1 mg total) by mouth daily. Yes Yes   FLUoxetine (PROZAC) 10 mg capsuleLast dose:  --  Take 1 capsule (10 mg total) by mouth daily. Yes Yes   megestroL (MEGACE) 20 mg tabletLast dose:  --  Take 1 tablet (20 mg total) by mouth daily. Yes Yes   spironolactone (ALDACTONE) 25 mg tabletLast dose:  --  Take 1 tablet (25 mg total) by mouth daily. Yes Yes   Prior to admission medications last reviewed by Carollee Sires., MD on Tue Sep 17, 2022 0114 Current Facility-Administered Medications Medication Dose Route Frequency Provider Last Rate Last Admin  [Held by provider] enoxaparin (LOVENOX) syringe 40 mg  40 mg Subcutaneous Daily Desouza, Wilber Bihari., MD     Recommendations:- Return patient to hospital ward for ongoing care. -  Advance diet as tolerated. - Continue present medications. - Await pathology results. - Use Protonix (pantoprazole) 40 mg PO daily for 2 weeks. - Although minimal, more bleeding than expected was noted from patients biopsy sites on the upper endoscopy. This was not noted on the colonoscopy.Final procedure note will be available in Epic under Chart Review -> Procedures? tab once completed by attending with finalized findings and recommendations.Leonette Nutting, MD

## 2022-09-18 NOTE — Plan of Care
Problem: Adult Inpatient Plan of CareGoal: Plan of Care ReviewOutcome: Interventions implemented as appropriate Plan of Care Overview/ Patient Status    Resumed care 2330-0700.Patient is A&Ox4, stable. No acute distress noted. Patient resting comfortably, NPO at this time. Safety measures in place.

## 2022-09-18 NOTE — OR Nursing
Pt admitted to room 4. Timeout performed. Pt placed on L  lateral side. Sedation given by anesthesia. Bite block in for procedure. EGD performed. Scope passed to duodenum. Random duodenum bxs, random gastric bxs obtained. 16  mm dura clip placed to stomach. Esophageal bxs obtained. Pt tolerated procedure well. Colonoscopy performed. Scope passed. Scope changed out for PCF. Scope passed. Cecum reached. Terminal ilium bxs obtained, random right colon bxs, random left colon bxs obtained. Abdomen soft. Pt tolerated procedure well. Pt sent to RR. Report given to RN in RR.

## 2022-09-18 NOTE — Other
Endoscopy Handoff ReportProcedure performed:EGD and colon with BXSedation: generalMental status:awakeIVF received:1600Pain: 0Last set of ZO:XWRU:  [97.6 F (36.4 C)-98.3 F (36.8 C)] 97.8 F (36.6 C)Pulse:  [56-148] 93Resp:  [9-17] 17BP: (72-101)/(37-68) 99/59SpO2:  [91 %-100 %] 100 %Device (Oxygen Therapy): room airLDA's:Periph IV 09/16/22 1804 basilic (5th finger side), right 20 gauge (Active) SiteCare/Dressing Status/Securement dressing dry and intact 09/18/22 0000 Date Dressing Applied/Changed 09/16/22 09/18/22 0000 Next Date Dressing Change 09/23/2022 09/18/22 0000 Lumen 1 Patency/Care Patent;flushed w/o difficulty 09/18/22 0000 Site Signs asymptomatic without redness, warmth, swelling, pain, palpable cord, streak formation, drainage, dressing intact 09/18/22 0000 Phlebitis 0-->no symptoms 09/18/22 0000 Infiltration/Extravasation Assessment 0-->no symptoms 09/18/22 0000 Patient Education Instructed to keep IV site dry;Instructed to call nurse if site is painful,red,swollen, burning;Instructed to call nurse if fluid leaking from site 09/18/22 0000 Daily Review of Necessity ** completed 09/18/22 0000 	Please see provation report (located under procedures tab under chart review) and/or proceduralist's note for additional details and recommendations.PACU RN: Eunice Blase RNContact #: [phone]

## 2022-09-18 NOTE — Anesthesia Pre-Procedure Evaluation
This is a 27 y.o. female scheduled for UPPER GI ENDOSCOPY; DX, W/WO SPECIMEN COLLECTION, BRUSHING/WASHING (SEP PROC)COLONOSCOPY, FLEXIBLE; DX, W/WO SPECIMENS/COLON DECOMP (SEP PROC).Review of Systems/ Medical HistoryPatient summary, nursing notes, EKG/Cardiac Studies  and Labs reviewed.No previous anesthesia concernsAnesthesia Evaluation:   No history of anesthetic complications  Estimated body mass index is 15.62 kg/m? as calculated from the following:  Height as of this encounter: 5' 4 (1.626 m).  Weight as of this encounter: 41.3 kg. CC/HPI: 27 yo F with a history of depression and anxiety presenting with ABD pain, poor PO intake, loose stools, and weight loss. Scheduled for an EGD/colonoscopy. Past Surgical History:  CLEFT PALATE REPAIRBREAST ENHANCEMENT SURGERYCardiovascular:    -Exercise tolerance: >4 METS -Vascular Disease:  Negative    -Other Cardiovascular:   09/16/22 EKG- Sinus rhythm:Minimal ST depression, inferior leads:. Respiratory:  Negative.     -Nicotine Dependence: noHEENT: Negative.Neuromuscular: NegativeSkeletal/Skin:  NegativeGastrointestinal/Genitourinary: -Nutritional Disorders: Pt is cachectic per BMI definition, (BMI <20).Pt is not obese per BMI definition.-Renal Disorders:  She does not have renal insufficiency.. -Comments: 09/16/22 RUQ USIMPRESSION:Etiology for patient's abdominal pain not identified by this exam. Findings as described above.Hematological/Lymphatic: Negative-Anemia:  Patient does not have anemia.-Coagulopathy:  Patient does not have abnormal PT/PTT function. She has no thrombocytopenia.Behavioral/Social/Psychiatric & Syndromes:  Patient has anxiety and depression.Physical ExamCardiovascular:    normal exam  Pulmonary:  normal exam  Airway:  Mallampati: ITM distance: >3 FBNeck ROM: fullMouth Opening: >3cmAnesthesia PlanASA 2 The primary anesthesia plan is general. Perioperative Code Status confirmed: It is my understanding that the patient is currently designated as 'Full Code' and will remain so throughout the perioperative period.Anesthesia informed consent obtained. Anesthesia written consent obtainedConsent obtained from: patientUse of blood products: consented    Consent Comment: Risks and benefits of anesthesia explained to patient. Patient agrees to proceed.Plan discussed with CRNA providerThe post operative pain plan is IV analgesics and per surgeon management.Plan discussed with Attending and CRNA.Anesthesiologist's Pre Op NoteI personally evaluated and examined the patient prior to the intra-operative phase of care on the day of the procedure.Marland Kitchen

## 2022-09-18 NOTE — Other
Operative Diagnosis:Pre-op:   Diarrhea, unspecified type  Patient Coded Diagnosis   Pre-op diagnosis: Diarrhea, unspecified type  Post-op diagnosis: Diarrhea, unspecified type  Patient Diagnosis   None    Post-op diagnosis:   * Diarrhea, unspecified type [R19.7]Operative Procedure(s) :Procedure(s) (LRB):UPPER GI ENDOSCOPY; DX, W/WO SPECIMEN COLLECTION, BRUSHING/WASHING (SEP PROC) (N/A)COLONOSCOPY, FLEXIBLE; DX, W/WO SPECIMENS/COLON DECOMP (SEP PROC) (N/A)Post-op Procedure & Diagnosis ConfirmationPost-op Diagnosis: Post-op Diagnosis updated (see notes)     - Diarrhea, unspecified typePost-op Procedure: Post-op Procedure updated (see notes)     - UPPER GI ENDOSCOPY; DX, W/WO SPECIMEN COLLECTION, BRUSHING/WASHING (Anesthesia ClarifiersGI/Endoscopy: Non-Screening Colonoscopy (see above for post-diagnosis and procedure)

## 2022-09-19 ENCOUNTER — Encounter: Admit: 2022-09-19 | Payer: PRIVATE HEALTH INSURANCE

## 2022-09-19 ENCOUNTER — Encounter: Admit: 2022-09-19 | Payer: PRIVATE HEALTH INSURANCE | Attending: Certified Registered"

## 2022-09-19 LAB — TISSUE TRANSGLUTAMINASE, IGA: BKR TTG IGA: <0.4 EliA U/mL — ABNORMAL LOW (ref 35.00–<7.0)

## 2022-09-19 NOTE — Telephone Encounter
Discharge Outreach Patient Follow UpPatient transferred to RN line from agent completing a post discharge follow up outreach call for further evaluation regarding New or ongoing symptoms.After reviewing the chart, after visit summary, and speaking with the patient. Triage Protocol launched and Provider contactedPatient experiencing weakness and fatigue since colonoscopy/endoscopy yesterday. Intermittent cramping and lower abdominal pressure. Small episode of rectal bleeding this morning. Patient also has questions about the findings, states she reviewed her results in MyChart and has some concerns. She also asks if she should be on Nexium instead of protonix. She also asks why remeron was not prescribed upon discharge. Advised patient that I would contact inpatient team.MHB message to Darryl Lent, PA who states she will call the patient.Patient: verbalizes understandingProtocol used: Otelia Limes for Call Diana Guebara is a 27 y.o. female who contacted [place] for Fatigue (Fatigue and weakness s/p colonoscopy and endoscopy yesterday)Her PCP is No primary care provider on file..Assessment: Patient denies all higher acuity assessments within protocol.  Additional assessment details below.Care Advice and Protocol Used Care Advice Given   Given By [facility] At [facility], Irving Burton, RN 09/19/2022  2:22 PM No  CALL PCP NOW: * You need to discuss this with your doctor (or NP/PA).* I'll page the on-call provider now. If you haven't heard from the provider (or me) within 30 minutes, call again.  Gwynneth Macleod, RN 09/19/2022  2:22 PM No  CALL BACK IF:* You become worse  Gwynneth Macleod, RN 09/19/2022  2:22 PM No  CARE ADVICE given per Colonoscopy Symptoms and Questions (Adult) guideline.   Patient/Caregiver understands and will follow care advice?  Yes, plans to follow advice  Protocols (Most recently selected listed first)   Name Status COLONOSCOPY SYMPTOMS AND QUESTIONS-ADULT-AH Used   Electronically Signed by Gwynneth Macleod, RN, September 19, 2022 Reason for Disposition [1] Caller has URGENT question or concern AND [2] triager unable to answer questionAnswer Assessment - Initial Assessment Questions1. DATE/TIME: When did you have your colonoscopy?     Yesterday2. MAIN CONCERN: What is your main concern right now? What questions do you have?    Feeling weak and fatigued. Feels pressure in lower abdomen like she needs to move her bowels but can't. Lower pelvic pressure.3. ABDOMEN PAIN: Are you having any abdomen (belly or stomach) pain? If Yes, ask: How bad is it? (e.g., Scale 1-10; mild, moderate, severe).   - MILD (1-3): doesn't interfere with normal activities, abdomen soft and not tender to touch    - MODERATE (4-7): interferes with normal activities or awakens from sleep, abdomen tender to touch    - SEVERE (8-10): excruciating pain, doubled over, unable to do any normal activities      4 out of 10, described as pressure and intermittent cramping4. OTHER SYMPTOMS: What other symptoms are you having? (e.g., rectal bleeding, bloating or feeling gassy, passing gas, vomiting, dizziness, fever).    Patient reports she had small amount of rectal bleeding while in the hospital, GI was aware and stated it was not significant. Had another episode at home this morning but states it was mild, it wasn't much. Strands of little clots5. ONSET: When did your symptoms start?    More today, very hard to get out of bed. States she was late to work.6. PATTERN: Is the symptom(s) constant or does it come and go? Is your symptom(s) getting worse, better, or staying the same?    ConstantProtocols used: Colonoscopy Symptoms and Questions-ADULT-AH

## 2022-09-19 NOTE — Progress Notes
[facility] Care Navigation:  Transition of Care Note Care Navigation spoke with: PatientDischarging Hospital[facility] Admission Date: 3/25/2024Hospital Discharge Date: 09/18/2022    Diagnosis: Abdominal pain, unspecified abdominal locationDischarge location: HomeRisk of Unplanned Readmission: 9.49%HOSPITALIZATION:Reason patient admitted: Abdominal pain, unspecified abdominal locationDiagnosis at discharge: Abdominal pain, unspecified abdominal locationCURRENT STATE:Since discharge patient reports feeling: Better Patient cared for by: None  REVIEW OF AFTER VISIT SUMMARY DOCUMENT: MEDICATION CHANGES:Validated NEW medications to take: N/Awill pick up tomorrowValidated Changed medications to take: N/AValidated Stopped medications to NOT take: N/AIssues obtaining prescriptions: N/Awill pick up tomorrow FOLLOW-UP APPOINTMENTS and TRANSPORTATION:Patient aware of scheduled appointments: N/AAwareness and assistance with appointments needing to be scheduled: Yes and assistance providedTransportation concerns for follow-up appointment: NoDME and HOME HEALTH SERVICES:Durable medical equipment received: N/AContact has been made with home care agency: N/APlan established for follow up labs/tests: N/A ADDITIONAL PATIENT NEEDS:

## 2022-09-20 ENCOUNTER — Encounter: Admit: 2022-09-20 | Payer: PRIVATE HEALTH INSURANCE | Attending: Gastroenterology

## 2022-09-20 LAB — ZINC: ZINC: 122 ug/dL (ref 60–130)

## 2022-09-20 LAB — COPPER, SERUM: COPPER: 79 ug/dL (ref 70–175)

## 2022-09-21 LAB — VITAMIN E
ALPHA-TOCOPHEROL: 7.5 mg/L (ref 5.7–19.9)
BETA-GAMA TOCOPHEROL: <1 mg/L (ref <=4.3)

## 2022-09-21 LAB — VITAMIN A (BH GH LMW Q YH): VITAMIN A LEVEL: 37 ug/dL — ABNORMAL LOW (ref 38–98)

## 2022-09-22 LAB — ALPHA-1-ANTITRYPSIN, FECES, RANDOM     (BH GH LMW Q YH): ALPHA-1-ANTITRYPSIN, FECES, RANDOM: <25 mg/dL (ref <55)

## 2022-09-23 LAB — CAROTENE, SERUM: CAROTENE: 13 ug/dL (ref 6–77)

## 2022-09-24 LAB — PANCREATIC ELASTASE-1     (BH GH LMW Q YH): PANCREATIC ELASTASE-1: 134 ug/g — ABNORMAL LOW

## 2022-09-27 NOTE — ED Provider Notes
Chief Complaint Patient presents with ? Abdominal Pain   Epigastric pain for about a month, having black stools Friday and Saturday, severe nausea, and vomiting with any food intake, about 20lb weight loss over the past few months and about 10lb weight loss over a couple of weeks, also having hair loss,  MDM --------------------------------------- Resident Note----------------------------------------------SynopsisPt is a 27 y.o. year old female presenting with chief complaint of abdominal pain and weight loss.  Patient reports over 20 lb weight loss last few months.  Most recently 5 lb loss in the last week.  Also reports upper abdominal pain.  States she had workup including CTAP in [place].  Unable to find cause of her symptoms at that time.  Reports poor p.o. intake and diarrhea. No vomiting. Has noted some dark stools, no bright red blood.  Also had some night sweats.  Reports family history of UC and Crohn's.  Has never had colonoscopy in the past.  Pertinent Physical Exam findings:BP 100/67 T 97.8? F HR 92 RR 18Patient alert.  No acute distress.Normal S1-S2 RRR. Lungs CTA bilaterallyAbdomen is soft- mild epigastric tendernessNo bright red blood on rectal exam.  Guaiac neg. Physical exam otherwise stated aboveMDM/Plan: Pt is a 27 y.o. year old female presenting with chief complaint of abdominal pain and weight loss.  Per chart review patient had TV U/S 1/24 and Nml CTAP 10/23.Concern for electrolyte abnormality, thyroid dysfunction, inflammatory disorder.  Plan for basic labs, ESR/CRP TSH.  We will also obtain right upper quadrant ultrasound.ED Course: Patient signed out pending w/up.  If no acute findings anticipate d/c with GI f/up. Patient care and medical decision making occurred with oversight by .Attending Provider: Inez Pilgrim, MD-------------------------------------------------------------------------------------------------------- Gunnar Bulla, MDEmergency Medicine ResidentAvailable on Mobile HeartbeatDictation device may have been used, please excuse any spelling, word substitution or punctuation errors. Received patient in sign-out pending right upper quadrant ultrasound.  On re-evaluation, imaging and blood work and urine unremarkable.  Patient is still endorsing abdominal pain and nausea with 2 new episodes of vomiting.  Gastroenterology has been consulted.  Plan was for outpatient GI follow-up, however patient states she is not comfortable leaving hospital given uncertainty of when she will be evaluated from Gastroenterology and inability to tolerate p.o. intake.  Patient unable to tolerate p.o. intake since being here.  Given Pepcid as per GI recs, in addition to Toradol, Tylenol, Zofran.  Patient admitted to hospitalist for intractable pain with plan for GI to follow. Physical ExamED Triage Vitals [09/16/22 1333]BP: 100/67Pulse: (!) 92Pulse from  O2 sat: n/aResp: 18Temp: 97.8 ?F (36.6 ?C)Temp src: OralSpO2: 99 % BP (!) 100/57  - Pulse 73  - Temp 99.6 ?F (37.6 ?C) (Temporal)  - Resp 16  - SpO2 98% Physical Exam ProceduresAttestation/Critical CarePatient Reevaluation: Attending Supervised: ResidentI saw and examined the patient. I agree with the findings and plan of care as documented in the resident's note except as noted below. Additional acute and/or chronic problems addressed: Subacute weight loss, blood in stool, hair loss.VSS.No pallor.No thyromegaly.Normal cardiopulmonary exam.Mild RUQ tenderness.Nonfocal neuro exam.Care signed out pending diagnostics.Nada Libman Ana Woodroof------------------------------------------------------------------------------------3:06 PM - Assumed care from previous ED attending.  Weight loss, bloody stools.  Fam hx of crohn's.  F/u labs>dispo.6:11 PM - w/u overall reassuring, attempted PO trial but patient began vomiting. Given inability to tolerate PO patient uncomfortable w/ discharge home.  Case discussed w/ GI.  Admitted to med obs. Virginia Rochester, MD, PhD------------------------------------------------------------------------------------ Comments as of 09/26/22 2127 Mon Sep 16, 2022 1522 Received sign out. F/U RUQ US/bloods/UA. [  BC]  Comments User Index[BC] Asher Muir, MD   Clinical Impressions as of 09/26/22 2127 Abdominal pain, unspecified abdominal location  ED DispositionAdmit Calvert Cantor, MDResident03/25/24 [address], MDResident03/25/24 2004 Bryon Lions, MD PhD03/25/24 [address], MD04/04/24 2131
